# Patient Record
Sex: Female | Born: 1950 | Race: White | Hispanic: No | Marital: Married | Smoking: Never smoker
Health system: Southern US, Community
[De-identification: ages and names within clinical notes are randomized; demographics above are authoritative.]

## PROBLEM LIST (undated history)

## (undated) DIAGNOSIS — R413 Other amnesia: Secondary | ICD-10-CM

## (undated) DIAGNOSIS — D279 Benign neoplasm of unspecified ovary: Secondary | ICD-10-CM

## (undated) DIAGNOSIS — I10 Essential (primary) hypertension: Secondary | ICD-10-CM

## (undated) DIAGNOSIS — E785 Hyperlipidemia, unspecified: Secondary | ICD-10-CM

## (undated) DIAGNOSIS — E119 Type 2 diabetes mellitus without complications: Secondary | ICD-10-CM

## (undated) DIAGNOSIS — I639 Cerebral infarction, unspecified: Secondary | ICD-10-CM

## (undated) DIAGNOSIS — E039 Hypothyroidism, unspecified: Secondary | ICD-10-CM

## (undated) HISTORY — DX: Other amnesia: R41.3

## (undated) HISTORY — DX: Cerebral infarction, unspecified: I63.9

## (undated) HISTORY — PX: OTHER SURGICAL HISTORY: SHX169

## (undated) HISTORY — DX: Type 2 diabetes mellitus without complications: E11.9

## (undated) HISTORY — DX: Essential (primary) hypertension: I10

## (undated) HISTORY — DX: Benign neoplasm of unspecified ovary: D27.9

## (undated) HISTORY — DX: Hypothyroidism, unspecified: E03.9

## (undated) HISTORY — DX: Hyperlipidemia, unspecified: E78.5

---

## 1998-05-15 ENCOUNTER — Observation Stay (HOSPITAL_COMMUNITY): Admission: AD | Admit: 1998-05-15 | Discharge: 1998-05-16 | Payer: Self-pay | Admitting: Cardiology

## 1998-05-15 ENCOUNTER — Encounter: Payer: Self-pay | Admitting: Cardiology

## 2003-02-15 DIAGNOSIS — D279 Benign neoplasm of unspecified ovary: Secondary | ICD-10-CM

## 2003-02-15 HISTORY — PX: TOTAL ABDOMINAL HYSTERECTOMY W/ BILATERAL SALPINGOOPHORECTOMY: SHX83

## 2003-02-15 HISTORY — DX: Benign neoplasm of unspecified ovary: D27.9

## 2003-12-09 ENCOUNTER — Ambulatory Visit: Admission: RE | Admit: 2003-12-09 | Discharge: 2003-12-09 | Payer: Self-pay | Admitting: Gynecology

## 2003-12-16 ENCOUNTER — Inpatient Hospital Stay (HOSPITAL_COMMUNITY): Admission: RE | Admit: 2003-12-16 | Discharge: 2003-12-20 | Payer: Self-pay | Admitting: Gynecologic Oncology

## 2003-12-16 ENCOUNTER — Ambulatory Visit: Payer: Self-pay | Admitting: Internal Medicine

## 2004-01-20 ENCOUNTER — Ambulatory Visit: Admission: RE | Admit: 2004-01-20 | Discharge: 2004-01-20 | Payer: Self-pay | Admitting: Gynecologic Oncology

## 2005-02-07 IMAGING — CR DG CHEST 1V PORT
1 series · 1 of 1 positions shown · non-contrast
Comparison: none

CLINICAL DATA: Pelvic mass and progressive air space disease.
 PORTABLE CHEST:
 Comparing 12/18/03.  
 Borderline cardiomegaly is noted.  Low lung volumes are present.  There are patchy bilateral air space opacities which could be a manifestation of atypical pneumonia or edema.  However these appear mildly improved compared to prior exam.

[view not recorded]
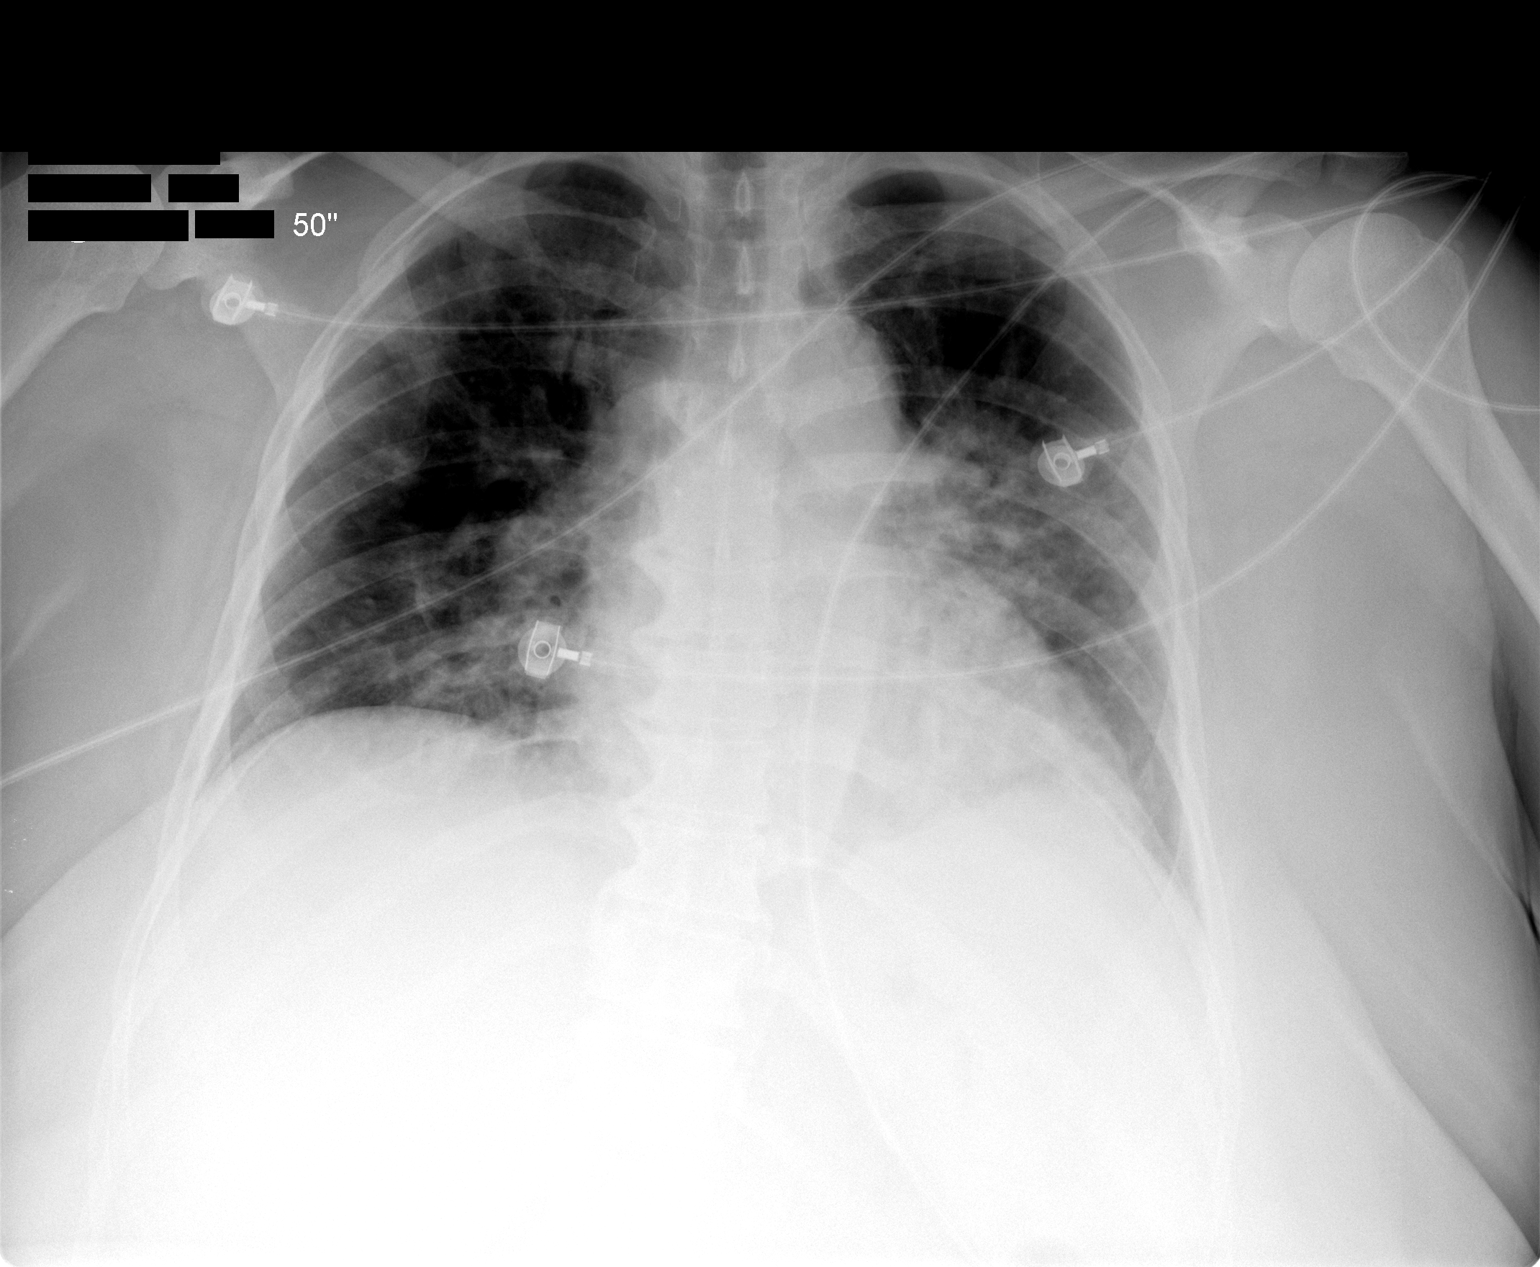

[1 of 1 positions shown; findings below may reference images not displayed]

IMPRESSION: Mildly improved air space opacities compared to the prior exam.

## 2007-03-18 DIAGNOSIS — I639 Cerebral infarction, unspecified: Secondary | ICD-10-CM

## 2007-03-18 HISTORY — DX: Cerebral infarction, unspecified: I63.9

## 2016-05-17 DIAGNOSIS — Z1389 Encounter for screening for other disorder: Secondary | ICD-10-CM | POA: Diagnosis not present

## 2016-05-17 DIAGNOSIS — E119 Type 2 diabetes mellitus without complications: Secondary | ICD-10-CM | POA: Diagnosis not present

## 2016-05-17 DIAGNOSIS — N3281 Overactive bladder: Secondary | ICD-10-CM | POA: Diagnosis not present

## 2016-05-17 DIAGNOSIS — I10 Essential (primary) hypertension: Secondary | ICD-10-CM | POA: Diagnosis not present

## 2016-05-17 DIAGNOSIS — F3289 Other specified depressive episodes: Secondary | ICD-10-CM | POA: Diagnosis not present

## 2016-05-17 DIAGNOSIS — E784 Other hyperlipidemia: Secondary | ICD-10-CM | POA: Diagnosis not present

## 2016-05-17 DIAGNOSIS — E1140 Type 2 diabetes mellitus with diabetic neuropathy, unspecified: Secondary | ICD-10-CM | POA: Diagnosis not present

## 2016-05-17 DIAGNOSIS — E1142 Type 2 diabetes mellitus with diabetic polyneuropathy: Secondary | ICD-10-CM | POA: Diagnosis not present

## 2016-05-17 DIAGNOSIS — Z6841 Body Mass Index (BMI) 40.0 and over, adult: Secondary | ICD-10-CM | POA: Diagnosis not present

## 2016-05-17 DIAGNOSIS — E038 Other specified hypothyroidism: Secondary | ICD-10-CM | POA: Diagnosis not present

## 2016-05-17 DIAGNOSIS — I638 Other cerebral infarction: Secondary | ICD-10-CM | POA: Diagnosis not present

## 2016-07-26 DIAGNOSIS — H524 Presbyopia: Secondary | ICD-10-CM | POA: Diagnosis not present

## 2016-07-26 DIAGNOSIS — H2513 Age-related nuclear cataract, bilateral: Secondary | ICD-10-CM | POA: Diagnosis not present

## 2016-07-26 DIAGNOSIS — H25013 Cortical age-related cataract, bilateral: Secondary | ICD-10-CM | POA: Diagnosis not present

## 2016-07-26 DIAGNOSIS — E119 Type 2 diabetes mellitus without complications: Secondary | ICD-10-CM | POA: Diagnosis not present

## 2016-09-02 DIAGNOSIS — G3184 Mild cognitive impairment, so stated: Secondary | ICD-10-CM | POA: Diagnosis not present

## 2016-09-02 DIAGNOSIS — E784 Other hyperlipidemia: Secondary | ICD-10-CM | POA: Diagnosis not present

## 2016-09-02 DIAGNOSIS — I10 Essential (primary) hypertension: Secondary | ICD-10-CM | POA: Diagnosis not present

## 2016-09-02 DIAGNOSIS — F329 Major depressive disorder, single episode, unspecified: Secondary | ICD-10-CM | POA: Diagnosis not present

## 2016-09-02 DIAGNOSIS — I639 Cerebral infarction, unspecified: Secondary | ICD-10-CM | POA: Diagnosis not present

## 2016-09-02 DIAGNOSIS — Z1389 Encounter for screening for other disorder: Secondary | ICD-10-CM | POA: Diagnosis not present

## 2016-09-02 DIAGNOSIS — E1142 Type 2 diabetes mellitus with diabetic polyneuropathy: Secondary | ICD-10-CM | POA: Diagnosis not present

## 2016-09-02 DIAGNOSIS — Z6841 Body Mass Index (BMI) 40.0 and over, adult: Secondary | ICD-10-CM | POA: Diagnosis not present

## 2016-09-02 DIAGNOSIS — E1140 Type 2 diabetes mellitus with diabetic neuropathy, unspecified: Secondary | ICD-10-CM | POA: Diagnosis not present

## 2016-09-02 DIAGNOSIS — E038 Other specified hypothyroidism: Secondary | ICD-10-CM | POA: Diagnosis not present

## 2016-10-05 DIAGNOSIS — R9089 Other abnormal findings on diagnostic imaging of central nervous system: Secondary | ICD-10-CM | POA: Diagnosis not present

## 2016-10-05 DIAGNOSIS — R4182 Altered mental status, unspecified: Secondary | ICD-10-CM | POA: Diagnosis not present

## 2016-10-05 DIAGNOSIS — R350 Frequency of micturition: Secondary | ICD-10-CM | POA: Diagnosis not present

## 2016-10-05 DIAGNOSIS — E119 Type 2 diabetes mellitus without complications: Secondary | ICD-10-CM | POA: Diagnosis not present

## 2016-10-05 DIAGNOSIS — R9082 White matter disease, unspecified: Secondary | ICD-10-CM | POA: Diagnosis not present

## 2016-10-05 DIAGNOSIS — R413 Other amnesia: Secondary | ICD-10-CM | POA: Diagnosis not present

## 2016-10-05 DIAGNOSIS — I10 Essential (primary) hypertension: Secondary | ICD-10-CM | POA: Diagnosis not present

## 2016-10-05 DIAGNOSIS — I672 Cerebral atherosclerosis: Secondary | ICD-10-CM | POA: Diagnosis not present

## 2016-10-06 DIAGNOSIS — R4182 Altered mental status, unspecified: Secondary | ICD-10-CM | POA: Diagnosis not present

## 2016-10-06 DIAGNOSIS — I672 Cerebral atherosclerosis: Secondary | ICD-10-CM | POA: Diagnosis not present

## 2016-10-06 DIAGNOSIS — R9089 Other abnormal findings on diagnostic imaging of central nervous system: Secondary | ICD-10-CM | POA: Diagnosis not present

## 2016-10-06 DIAGNOSIS — R9082 White matter disease, unspecified: Secondary | ICD-10-CM | POA: Diagnosis not present

## 2016-10-12 ENCOUNTER — Ambulatory Visit (INDEPENDENT_AMBULATORY_CARE_PROVIDER_SITE_OTHER): Payer: Medicare Other | Admitting: Diagnostic Neuroimaging

## 2016-10-12 ENCOUNTER — Encounter: Payer: Self-pay | Admitting: *Deleted

## 2016-10-12 DIAGNOSIS — R413 Other amnesia: Secondary | ICD-10-CM

## 2016-10-12 NOTE — Patient Instructions (Signed)
Thank you for coming to see [place] at [facility]. I hope we have been able to provide you high quality care today.  You may receive a patient satisfaction survey over the next few weeks. We would appreciate your feedback and comments so that we may continue to improve ourselves and the health of our patients.  - check MRI brain   - check B12   - visit alz.org  - consider MIND diet   ~~~~~~~~~~~~~~~~~~~~~~~~~~~~~~~~~~~~~~~~~~~~~~~~~~~~~~~~~~~~~~~~~  DR. Willoughby Doell'S GUIDE TO HAPPY AND HEALTHY LIVING These are some of my general health and wellness recommendations. Some of them may apply to you better than others. Please use common sense as you try these suggestions and feel free to ask me any questions.   ACTIVITY/FITNESS Mental, social, emotional and physical stimulation are very important for brain and body health. Try learning a new activity (arts, music, language, sports, games).  Keep moving your body to the best of your abilities. You can do this at home, inside or outside, the park, community center, gym or anywhere you like. Consider a physical therapist or personal trainer to get started. Consider the app Sworkit. Fitness trackers such as smart-watches, smart-phones or Fitbits can help as well.   NUTRITION Eat more plants: colorful vegetables, nuts, seeds and berries.  Eat less sugar, salt, preservatives and processed foods.  Avoid toxins such as cigarettes and alcohol.  Drink water when you are thirsty. Warm water with a slice of lemon is an excellent morning drink to start the day.  Consider these websites for more information The Nutrition Source (https://www.henry-hernandez.biz/) Precision Nutrition (WindowBlog.ch)   RELAXATION Consider practicing mindfulness meditation or other relaxation techniques such as deep breathing, prayer, yoga, tai chi, massage. See website mindful.org or the apps Headspace or Calm  to help get started.   SLEEP Try to get at least 7-8+ hours sleep per day. Regular exercise and reduced caffeine will help you sleep better. Practice good sleep hygeine techniques. See website sleep.org for more information.   PLANNING Prepare estate planning, living will, healthcare POA documents. Sometimes this is best planned with the help of an attorney. Theconversationproject.org and agingwithdignity.org are excellent resources.

## 2016-10-12 NOTE — Progress Notes (Signed)
[facility]  PATIENT: Deanna Fisher DOB: [birth year]  REFERRING CLINICIAN: S South HISTORY FROM: patient  REASON FOR VISIT: new consult    HISTORICAL  CHIEF COMPLAINT:  Chief Complaint  Patient presents with  . Gait Problem    rm 7, New Pt, Husband- Bucky, dagtrs- Trellis Moment  . Mild cognitive impairment    MMSE 17    HISTORY OF PRESENT ILLNESS:   66 year old female here for evaluation of memory problems.  For past 6-12 months patient has had onset of short-term memory problems, confusion, getting lost driving, increasing depression. She is more emotional. Patient staying home more. She's not taking her medications properly. Patient forgetting status of her parents (who are deceased) but asking her own family if they're still alive.  10/05/16 patient had a stressful day taking care of some grandchildren, and when her husband came home she was significantly withdrawn, verbally unresponsive, holding her hands and shaking. She was taken to the hospital for evaluation ([place], [place]). By the time patient came to the hospital she was returning back to baseline. CT scan and basic lab testing were unremarkable. Patient was discharged and recommended to follow-up in outpatient neurology clinic.   REVIEW OF SYSTEMS: Full 14 system review of systems performed and negative with exception of: Fatigue chest pain blurred vision eye pain snoring easy bruising feeling hot increased thirst depression decreased energy.  ALLERGIES: No Known Allergies  HOME MEDICATIONS: No outpatient prescriptions prior to visit.   No facility-administered medications prior to visit.     PAST MEDICAL HISTORY: Past Medical History:  Diagnosis Date  . CVA (cerebral vascular accident) ([place]) 03/2007  . Diabetes ([place])    type 2  . HTN (hypertension)   . Hyperlipidemia   . Hypothyroid   . Memory loss   . Struma ovarii 2005    PAST SURGICAL HISTORY: Past Surgical History:    Procedure Laterality Date  . CESAREAN SECTION    . peritoneal tumor resection    . TOTAL ABDOMINAL HYSTERECTOMY W/ BILATERAL SALPINGOOPHORECTOMY  2005    FAMILY HISTORY: Family History  Problem Relation Age of Onset  . Cancer Mother        thyroid  . Dementia Mother   . Cancer Father        leukemia  . Other Sister        wagoner's disease  . Cystic fibrosis Sister     SOCIAL HISTORY:  Social History   Social History  . Marital status: Married    Spouse name: N/A  . Number of children: 4  . Years of education: 8   Occupational History  .      NA   Social History Main Topics  . Smoking status: Never Smoker  . Smokeless tobacco: Never Used  . Alcohol use No  . Drug use: No  . Sexual activity: Not on file   Other Topics Concern  . Not on file   Social History Narrative   Lives with husband   Caffeine- 1-2 tea      PHYSICAL EXAM  GENERAL EXAM/CONSTITUTIONAL: Vitals:  Vitals:   10/12/16 0853  BP: 102/61  Pulse: 75  Weight: 227 lb 12.8 oz (103.3 kg)  Height: 5\' 2"  (1.575 m)     Body mass index is 41.67 kg/m.  Visual Acuity Screening   Right eye Left eye Both eyes  Without correction:     With correction: 20/30 20/30      Patient is in no distress;  well developed, nourished and groomed; neck is supple  CARDIOVASCULAR:  Examination of carotid arteries is normal; no carotid bruits  Regular rate and rhythm, no murmurs  Examination of peripheral vascular system by observation and palpation is normal  EYES:  Ophthalmoscopic exam of optic discs and posterior segments is normal; no papilledema or hemorrhages  MUSCULOSKELETAL:  Gait, strength, tone, movements noted in Neurologic exam below  NEUROLOGIC: MENTAL STATUS:  MMSE - Mini Mental State Exam 10/12/2016  Orientation to time 1  Orientation to Place 5  Registration 2  Attention/ Calculation 1  Recall 0  Language- name 2 objects 2  Language- repeat 0  Language- follow 3 step  command 3  Language- read & follow direction 1  Write a sentence 1  Copy design 1  Total score 17    awake, alert, oriented to person, place and time  DECR MEMORY; remote memory intact  normal attention and concentration  language fluent, comprehension intact, naming intact,   fund of knowledge appropriate  INT TEARFUL  DECR INSIGHT  CRANIAL NERVE:   2nd - no papilledema on fundoscopic exam  2nd, 3rd, 4th, 6th - pupils equal and reactive to light, visual fields full to confrontation, extraocular muscles intact, no nystagmus  5th - facial sensation symmetric  7th - facial strength symmetric  8th - hearing intact  9th - palate elevates symmetrically, uvula midline  11th - shoulder shrug symmetric  12th - tongue protrusion midline  MOTOR:   normal bulk and tone, full strength in the BUE, BLE  SENSORY:   normal and symmetric to light touch, temperature, vibration  COORDINATION:   finger-nose-finger, fine finger movements normal  REFLEXES:   deep tendon reflexes present and symmetric  GAIT/STATION:   narrow based gait    DIAGNOSTIC DATA (LABS, IMAGING, TESTING) - I reviewed patient records, labs, notes, testing and imaging myself where available.  No results found for: WBC, HGB, HCT, MCV, PLT No results found for: NA, K, CL, CO2, GLUCOSE, BUN, CREATININE, CALCIUM, PROT, ALBUMIN, AST, ALT, ALKPHOS, BILITOT, GFRNONAA, GFRAA No results found for: CHOL, HDL, LDLCALC, LDLDIRECT, TRIG, CHOLHDL No results found for: HGBA1C No results found for: VITAMINB12 No results found for: TSH      ASSESSMENT AND PLAN  66 y.o. year old female here with 6-12 months of gradual onset progressive short-term memory loss, confusion, difficulty with driving dresses, with sudden confusion and withdrawal on 10/05/16 in the setting of increased stress. MMSE 17 out of 30. 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 Episode on 10/05/16 may have been a  stress reaction/depression event.    Dx: moderate dementia + depression  1. Memory loss      PLAN:  I spent 60 minutes of face to face time with patient. Greater than 50% of time was spent in counseling and coordination of care with patient. In summary we discussed:   - check MRI brain (rule out other causes of memory loss; vascular, inflamm, infx) - check B12 - encouraged brain healthy activities - dementia resources reviewed and provided to patient and family - may consider donepezil and memantine at next visit - continue citalopram for depression  Orders Placed This Encounter  Procedures  . MR BRAIN WO CONTRAST  . Vitamin B12   Return in about 3 months (around 01/12/2017).    Penni Bombard, MD 1/66/0630, 1:60 AM Certified in Neurology, Neurophysiology and Neuroimaging  [facility] [address], [place], [place] [address] [phone]

## 2016-10-13 LAB — VITAMIN B12: Vitamin B-12: 284 pg/mL (ref 232–1245)

## 2016-10-18 ENCOUNTER — Telehealth: Payer: Self-pay | Admitting: *Deleted

## 2016-10-18 NOTE — Telephone Encounter (Signed)
Spoke with daughter, Janett Billow on [place] and informed her the patient's lab, Vit B12 level is normal. She stated she would let patient know, verbalized appreciation.

## 2016-10-22 ENCOUNTER — Ambulatory Visit
Admission: RE | Admit: 2016-10-22 | Discharge: 2016-10-22 | Disposition: A | Discharge status: Home / Self Care | Payer: Medicare Other | Source: Ambulatory Visit | Attending: Diagnostic Neuroimaging | Admitting: Diagnostic Neuroimaging

## 2016-10-22 DIAGNOSIS — R413 Other amnesia: Secondary | ICD-10-CM | POA: Diagnosis not present

## 2016-10-23 ENCOUNTER — Other Ambulatory Visit: Payer: Self-pay

## 2016-10-25 ENCOUNTER — Ambulatory Visit: Payer: Medicare Other | Admitting: Diagnostic Neuroimaging

## 2016-11-02 ENCOUNTER — Telehealth: Payer: Self-pay | Admitting: Diagnostic Neuroimaging

## 2016-11-02 NOTE — Telephone Encounter (Signed)
PTs daughter already call about MRI  by Bedelia Person. See note from 11/02/2016.

## 2016-11-02 NOTE — Telephone Encounter (Signed)
Patient's daughterr calling to get MRI results.

## 2016-11-02 NOTE — Telephone Encounter (Signed)
Spoke to Damar, daughter of pt.  Relayed that MRI results as per Dr. Gladstone Lighter result note.  She verbalized understanding.

## 2016-11-02 NOTE — Telephone Encounter (Signed)
Left vm for patient to call back about MRI results.

## 2017-01-04 DIAGNOSIS — I10 Essential (primary) hypertension: Secondary | ICD-10-CM | POA: Diagnosis not present

## 2017-01-04 DIAGNOSIS — G3184 Mild cognitive impairment, so stated: Secondary | ICD-10-CM | POA: Diagnosis not present

## 2017-01-04 DIAGNOSIS — Z6841 Body Mass Index (BMI) 40.0 and over, adult: Secondary | ICD-10-CM | POA: Diagnosis not present

## 2017-01-04 DIAGNOSIS — F3289 Other specified depressive episodes: Secondary | ICD-10-CM | POA: Diagnosis not present

## 2017-01-04 DIAGNOSIS — E7849 Other hyperlipidemia: Secondary | ICD-10-CM | POA: Diagnosis not present

## 2017-01-04 DIAGNOSIS — Z23 Encounter for immunization: Secondary | ICD-10-CM | POA: Diagnosis not present

## 2017-01-04 DIAGNOSIS — E1142 Type 2 diabetes mellitus with diabetic polyneuropathy: Secondary | ICD-10-CM | POA: Diagnosis not present

## 2017-01-04 DIAGNOSIS — I6389 Other cerebral infarction: Secondary | ICD-10-CM | POA: Diagnosis not present

## 2017-01-04 DIAGNOSIS — N3281 Overactive bladder: Secondary | ICD-10-CM | POA: Diagnosis not present

## 2017-01-04 DIAGNOSIS — E1140 Type 2 diabetes mellitus with diabetic neuropathy, unspecified: Secondary | ICD-10-CM | POA: Diagnosis not present

## 2017-01-04 DIAGNOSIS — R2689 Other abnormalities of gait and mobility: Secondary | ICD-10-CM | POA: Diagnosis not present

## 2017-01-04 DIAGNOSIS — E538 Deficiency of other specified B group vitamins: Secondary | ICD-10-CM | POA: Diagnosis not present

## 2017-01-13 ENCOUNTER — Ambulatory Visit (INDEPENDENT_AMBULATORY_CARE_PROVIDER_SITE_OTHER): Payer: Medicare Other | Admitting: Diagnostic Neuroimaging

## 2017-01-13 ENCOUNTER — Encounter: Payer: Self-pay | Admitting: Diagnostic Neuroimaging

## 2017-01-13 VITALS — BP 123/75 | HR 74 | Ht 62.0 in | Wt 221.6 lb

## 2017-01-13 DIAGNOSIS — F0390 Unspecified dementia without behavioral disturbance: Secondary | ICD-10-CM | POA: Diagnosis not present

## 2017-01-13 DIAGNOSIS — R413 Other amnesia: Secondary | ICD-10-CM | POA: Diagnosis not present

## 2017-01-13 DIAGNOSIS — F03A0 Unspecified dementia, mild, without behavioral disturbance, psychotic disturbance, mood disturbance, and anxiety: Secondary | ICD-10-CM

## 2017-01-13 MED ORDER — MEMANTINE HCL 10 MG PO TABS: 10.0000 mg | ORAL_TABLET | Freq: Two times a day (BID) | ORAL | 12 refills | Status: DC

## 2017-01-13 NOTE — Patient Instructions (Signed)
-   start memantine 10mg  at bedtime x 1 week; then increase to twice a day

## 2017-01-13 NOTE — Progress Notes (Signed)
[facility]  PATIENT: Deanna Fisher DOB: 08-23-1950  REFERRING CLINICIAN: S South HISTORY FROM: patient and daughters [name], [place]) REASON FOR VISIT: follow up    HISTORICAL  CHIEF COMPLAINT:  Chief Complaint  Patient presents with  . Follow-up  . Memory Loss    doing about same.     HISTORY OF PRESENT ILLNESS:   UPDATE (01/13/17, VRP): Since last visit, doing well. Tolerating meds. No alleviating or aggravating factors. Overall stable. Slightly better with mood and alertness and depression.  PRIOR HPI (11/02/16, VRP): 66 year old female here for evaluation of memory problems. For past 6-12 months patient has had onset of short-term memory problems, confusion, getting lost driving, increasing depression. She is more emotional. Patient staying home more. She's not taking her medications properly. Patient forgetting status of her parents (who are deceased) but asking her own family if they're still alive. 10/05/16 patient had a stressful day taking care of some grandchildren, and when her husband came home she was significantly withdrawn, verbally unresponsive, holding her hands and shaking. She was taken to the hospital for evaluation ([facility], [place]). By the time patient came to the hospital she was returning back to baseline. CT scan and basic lab testing were unremarkable. Patient was discharged and recommended to follow-up in outpatient neurology clinic.   REVIEW OF SYSTEMS: Full 14 system review of systems performed and negative with exception of: confusion anxiety depression.  ALLERGIES: No Known Allergies  HOME MEDICATIONS: Outpatient Medications Prior to Visit  Medication Sig Dispense Refill  . amlodipine-atorvastatin (CADUET) 10-10 MG tablet Take 1 tablet by mouth daily.    Marland Kitchen aspirin EC 81 MG tablet Take 81 mg by mouth daily.    . citalopram (CELEXA) 20 MG tablet Take 20 mg by mouth daily.    Marland Kitchen gabapentin (NEURONTIN) 100 MG capsule Take 100 mg by  mouth daily.     . metoprolol tartrate (LOPRESSOR) 25 MG tablet Take 25 mg by mouth daily.     Marland Kitchen SIMVASTATIN PO Take by mouth.    . sitaGLIPtin-metformin (JANUMET) 50-1000 MG tablet Take 1 tablet by mouth daily.     Marland Kitchen UNABLE TO FIND daily. Med Name: glimepiride      No facility-administered medications prior to visit.     PAST MEDICAL HISTORY: Past Medical History:  Diagnosis Date  . CVA (cerebral vascular accident) ([place]) 03/2007  . Diabetes ([place])    type 2  . HTN (hypertension)   . Hyperlipidemia   . Hypothyroid   . Memory loss   . Struma ovarii 2005    PAST SURGICAL HISTORY: Past Surgical History:  Procedure Laterality Date  . CESAREAN SECTION    . peritoneal tumor resection    . TOTAL ABDOMINAL HYSTERECTOMY W/ BILATERAL SALPINGOOPHORECTOMY  2005    FAMILY HISTORY: Family History  Problem Relation Age of Onset  . Cancer Mother        thyroid  . Dementia Mother   . Cancer Father        leukemia  . Other Sister        wagoner's disease  . Cystic fibrosis Sister     SOCIAL HISTORY:  Social History   Socioeconomic History  . Marital status: Married    Spouse name: Not on file  . Number of children: 4  . Years of education: 8  . Highest education level: Not on file  Social Needs  . Financial resource strain: Not on file  . Food insecurity - worry: Not on  file  . Food insecurity - inability: Not on file  . Transportation needs - medical: Not on file  . Transportation needs - non-medical: Not on file  Occupational History    Comment: NA  Tobacco Use  . Smoking status: Never Smoker  . Smokeless tobacco: Never Used  Substance and Sexual Activity  . Alcohol use: No  . Drug use: No  . Sexual activity: Not on file  Other Topics Concern  . Not on file  Social History Narrative   Lives with husband   Caffeine- 1-2 tea      PHYSICAL EXAM  GENERAL EXAM/CONSTITUTIONAL: Vitals:  Vitals:   01/13/17 0832  BP: 123/75  Pulse: 74  Weight: 221 lb 9.6 oz  (100.5 kg)  Height: 5\' 2"  (1.575 m)   Body mass index is 40.53 kg/m. No exam data present  Patient is in no distress; well developed, nourished and groomed; neck is supple  CARDIOVASCULAR:  Examination of carotid arteries is normal; no carotid bruits  Regular rate and rhythm, no murmurs  Examination of peripheral vascular system by observation and palpation is normal  EYES:  Ophthalmoscopic exam of optic discs and posterior segments is normal; no papilledema or hemorrhages  MUSCULOSKELETAL:  Gait, strength, tone, movements noted in Neurologic exam below  NEUROLOGIC: MENTAL STATUS:  MMSE - [place] Exam 01/13/2017 10/12/2016  Orientation to time 4 1  Orientation to Place 2 5  Registration 3 2  Attention/ Calculation 1 1  Recall 0 0  Language- name 2 objects 2 2  Language- repeat 1 0  Language- follow 3 step command 3 3  Language- read & follow direction 1 1  Write a sentence 1 1  Copy design 1 1  Total score 19 17    awake, alert, oriented to person, place and time  DECR MEMORY; remote memory intact  normal attention and concentration  language fluent, comprehension intact, naming intact,   fund of knowledge appropriate  CRANIAL NERVE:   2nd - no papilledema on fundoscopic exam  2nd, 3rd, 4th, 6th - pupils equal and reactive to light, visual fields full to confrontation, extraocular muscles intact, no nystagmus  5th - facial sensation symmetric  7th - facial strength symmetric  8th - hearing intact  9th - palate elevates symmetrically, uvula midline  11th - shoulder shrug symmetric  12th - tongue protrusion midline  MOTOR:   normal bulk and tone, full strength in the BUE, BLE  SENSORY:   normal and symmetric to light touch, temperature, vibration  COORDINATION:   finger-nose-finger, fine finger movements normal  REFLEXES:   deep tendon reflexes present and symmetric  GAIT/STATION:   narrow based gait    DIAGNOSTIC DATA  (LABS, IMAGING, TESTING) - I reviewed patient records, labs, notes, testing and imaging myself where available.  No results found for: WBC, HGB, HCT, MCV, PLT No results found for: NA, K, CL, CO2, GLUCOSE, BUN, CREATININE, CALCIUM, PROT, ALBUMIN, AST, ALT, ALKPHOS, BILITOT, GFRNONAA, GFRAA No results found for: CHOL, HDL, LDLCALC, LDLDIRECT, TRIG, CHOLHDL No results found for: HGBA1C Lab Results  Component Value Date   VITAMINB12 284 10/12/2016   No results found for: TSH  10/22/16 MRI brain 1.    Mild generalized cortical atrophy, progressed when compared to the 2009 MRI. The atrophy is most pronounced in the anterior temporal lobes. 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 2.    Moderate extent of T2/FLAIR foci in the pons and hemispheres consistent with artery to chronic medical vascular ischemic change,  also progress we'll compared to the 2009 MRI. There is a small stable lacunar infarction in the left thalamus. 3.    There are no acute findings.    ASSESSMENT AND PLAN  66 y.o. year old female here with 6-12 months of gradual onset progressive short-term memory loss, confusion, difficulty with driving directions, with sudden confusion and withdrawal on 10/05/16 in the setting of increased stress. MMSE 17/30 --19/30. 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 Episode on 10/05/16 may have been a stress reaction/depression event.    Dx: mild-moderate dementia + depression  1. Memory loss   2. Mild dementia      PLAN:  I spent 15 minutes of face to face time with patient. Greater than 50% of time was spent in counseling and coordination of care with patient. In summary we discussed:   MEMORY LOSS / DEMENTIA (mild-moderate) - encouraged brain healthy activities - dementia resources reviewed and provided to patient and family - start memantine 10mg  at bedtime x 1 week; then increase to twice a day   DEPRESSION - continue citalopram for depression  Meds  ordered this encounter  Medications  . memantine (NAMENDA) 10 MG tablet    Sig: Take 1 tablet (10 mg total) by mouth 2 (two) times daily.    Dispense:  60 tablet    Refill:  12   Return in about 1 year (around 01/13/2018).    Penni Bombard, MD 77/41/4239, 5:32 AM Certified in Neurology, Neurophysiology and Neuroimaging  [facility] [address] [place], [place] [address] [phone]

## 2017-05-10 DIAGNOSIS — E1140 Type 2 diabetes mellitus with diabetic neuropathy, unspecified: Secondary | ICD-10-CM | POA: Diagnosis not present

## 2017-05-10 DIAGNOSIS — E538 Deficiency of other specified B group vitamins: Secondary | ICD-10-CM | POA: Diagnosis not present

## 2017-05-10 DIAGNOSIS — E1142 Type 2 diabetes mellitus with diabetic polyneuropathy: Secondary | ICD-10-CM | POA: Diagnosis not present

## 2017-05-10 DIAGNOSIS — I639 Cerebral infarction, unspecified: Secondary | ICD-10-CM | POA: Diagnosis not present

## 2017-05-10 DIAGNOSIS — I10 Essential (primary) hypertension: Secondary | ICD-10-CM | POA: Diagnosis not present

## 2017-05-10 DIAGNOSIS — E7849 Other hyperlipidemia: Secondary | ICD-10-CM | POA: Diagnosis not present

## 2017-05-10 DIAGNOSIS — E038 Other specified hypothyroidism: Secondary | ICD-10-CM | POA: Diagnosis not present

## 2017-05-10 DIAGNOSIS — R269 Unspecified abnormalities of gait and mobility: Secondary | ICD-10-CM | POA: Diagnosis not present

## 2017-05-10 DIAGNOSIS — F329 Major depressive disorder, single episode, unspecified: Secondary | ICD-10-CM | POA: Diagnosis not present

## 2017-05-10 DIAGNOSIS — G3184 Mild cognitive impairment, so stated: Secondary | ICD-10-CM | POA: Diagnosis not present

## 2017-07-28 DIAGNOSIS — H25013 Cortical age-related cataract, bilateral: Secondary | ICD-10-CM | POA: Diagnosis not present

## 2017-07-28 DIAGNOSIS — H2513 Age-related nuclear cataract, bilateral: Secondary | ICD-10-CM | POA: Diagnosis not present

## 2017-07-28 DIAGNOSIS — H5203 Hypermetropia, bilateral: Secondary | ICD-10-CM | POA: Diagnosis not present

## 2017-07-28 DIAGNOSIS — E119 Type 2 diabetes mellitus without complications: Secondary | ICD-10-CM | POA: Diagnosis not present

## 2017-08-10 DIAGNOSIS — E1142 Type 2 diabetes mellitus with diabetic polyneuropathy: Secondary | ICD-10-CM | POA: Diagnosis not present

## 2017-08-10 DIAGNOSIS — F3289 Other specified depressive episodes: Secondary | ICD-10-CM | POA: Diagnosis not present

## 2017-08-10 DIAGNOSIS — Z1389 Encounter for screening for other disorder: Secondary | ICD-10-CM | POA: Diagnosis not present

## 2017-08-10 DIAGNOSIS — E038 Other specified hypothyroidism: Secondary | ICD-10-CM | POA: Diagnosis not present

## 2017-08-10 DIAGNOSIS — I10 Essential (primary) hypertension: Secondary | ICD-10-CM | POA: Diagnosis not present

## 2017-08-10 DIAGNOSIS — F0390 Unspecified dementia without behavioral disturbance: Secondary | ICD-10-CM | POA: Diagnosis not present

## 2017-08-10 DIAGNOSIS — E7849 Other hyperlipidemia: Secondary | ICD-10-CM | POA: Diagnosis not present

## 2017-08-10 DIAGNOSIS — E538 Deficiency of other specified B group vitamins: Secondary | ICD-10-CM | POA: Diagnosis not present

## 2017-08-10 DIAGNOSIS — E1140 Type 2 diabetes mellitus with diabetic neuropathy, unspecified: Secondary | ICD-10-CM | POA: Diagnosis not present

## 2017-08-10 DIAGNOSIS — I6389 Other cerebral infarction: Secondary | ICD-10-CM | POA: Diagnosis not present

## 2017-08-10 DIAGNOSIS — Z6841 Body Mass Index (BMI) 40.0 and over, adult: Secondary | ICD-10-CM | POA: Diagnosis not present

## 2017-09-26 ENCOUNTER — Telehealth: Payer: Self-pay | Admitting: Diagnostic Neuroimaging

## 2017-09-26 DIAGNOSIS — R21 Rash and other nonspecific skin eruption: Secondary | ICD-10-CM | POA: Diagnosis not present

## 2017-09-26 DIAGNOSIS — T50995A Adverse effect of other drugs, medicaments and biological substances, initial encounter: Secondary | ICD-10-CM | POA: Diagnosis not present

## 2017-09-26 NOTE — Telephone Encounter (Signed)
Pt's daughter, [name] not on DPR said PCP started her on respiradol for anxiety about 1-1/2 month ago. Pt did ok then it was increased again about 2 weeks ago and did ok. About 1 ago week the patient began hallucinating, not engaging in conversation. She has spoken with PCP office and was advised to make neurology appt. Since this daughter is not on DPR please call pt's husband Bucky/DPR at [phone] to discuss. The pt has an appt on 9/3, does the pt need to be seen sooner. This daughter brings the pt to her appt's and will be coming with her and will change the DPR.

## 2017-09-27 NOTE — Telephone Encounter (Signed)
Spoke with patient's husband, Bucky on [place]. He stated his wife's PCP advised daughter to cut Risperdal in 1/2 and give in two divided doses. He does not know strength of tablet. He stated she is still having hallucinations but has improved slightly. She is not very communicative at all.  This RN asked if he would like to see if she continues to improve and come in on 10/17/17 or reschedule. He would like her seen sooner than 10/17/17.  He can only be off work on Fridays. Scheduled her for this Fri, advised they arrive 30 minutes early, bring new insurance cards and her medication bottles. He repeated instructions correctly, verbalized understanding, appreciation.

## 2017-09-29 ENCOUNTER — Encounter: Payer: Self-pay | Admitting: Diagnostic Neuroimaging

## 2017-09-29 ENCOUNTER — Ambulatory Visit (INDEPENDENT_AMBULATORY_CARE_PROVIDER_SITE_OTHER): Payer: Medicare Other | Admitting: Diagnostic Neuroimaging

## 2017-09-29 VITALS — BP 124/68 | HR 73 | Ht 62.0 in | Wt 227.8 lb

## 2017-09-29 DIAGNOSIS — G308 Other Alzheimer's disease: Secondary | ICD-10-CM

## 2017-09-29 DIAGNOSIS — F0281 Dementia in other diseases classified elsewhere with behavioral disturbance: Secondary | ICD-10-CM

## 2017-09-29 NOTE — Progress Notes (Signed)
[facility]  PATIENT: Deanna Fisher DOB: August 18, 1950  REFERRING CLINICIAN: S [place] HISTORY FROM: patient and daughter Deanna Fisher) and husband REASON FOR VISIT: follow up    HISTORICAL  CHIEF COMPLAINT:  Chief Complaint  Patient presents with  . Memory Loss    rm 7, husband- Paramedic, dgtrRip Fisher MMSE 21  "was having hallucinations, noncommunicative; Risperdal doses spread out, much better today"  . Follow-up    HISTORY OF PRESENT ILLNESS:   UPDATE (09/29/17, VRP): Since last visit, doing poorly. Symptoms are significant, with threatening hallucinations since June 2019. Now on risperidal. Last few weeks, more sedation with increased risperidal dose. Now on twice a day dosing and feels better. No alleviating or aggravating factors. Tolerating memantine.    UPDATE (01/13/17, VRP): Since last visit, doing well. Tolerating meds. No alleviating or aggravating factors. Overall stable. Slightly better with mood and alertness and depression.  PRIOR HPI (11/02/16, VRP): 67 year old female here for evaluation of memory problems. For past 6-12 months patient has had onset of short-term memory problems, confusion, getting lost driving, increasing depression. She is more emotional. Patient staying home more. She's not taking her medications properly. Patient forgetting status of her parents (who are deceased) but asking her own family if they're still alive. 10/05/16 patient had a stressful day taking care of some grandchildren, and when her husband came home she was significantly withdrawn, verbally unresponsive, holding her hands and shaking. She was taken to the hospital for evaluation ([place], [place]). By the time patient came to the hospital she was returning back to baseline. CT scan and basic lab testing were unremarkable. Patient was discharged and recommended to follow-up in outpatient neurology clinic.   REVIEW OF SYSTEMS: Full 14 system review of systems performed and  negative with exception of: confusion anxiety depression.  ALLERGIES: No Known Allergies  HOME MEDICATIONS: Outpatient Medications Prior to Visit  Medication Sig Dispense Refill  . amlodipine-atorvastatin (CADUET) 10-10 MG tablet Take 1 tablet by mouth daily.    Marland Kitchen aspirin EC 81 MG tablet Take 81 mg by mouth daily.    . citalopram (CELEXA) 20 MG tablet Take 20 mg by mouth daily.    Marland Kitchen losartan (COZAAR) 100 MG tablet Take 100 mg by mouth daily.    . memantine (NAMENDA) 10 MG tablet Take 1 tablet (10 mg total) by mouth 2 (two) times daily. 60 tablet 12  . metoprolol tartrate (LOPRESSOR) 25 MG tablet Take 25 mg by mouth daily.     . risperiDONE (RISPERDAL) 0.25 MG tablet Take 0.25 mg by mouth at bedtime.    Marland Kitchen SIMVASTATIN PO Take by mouth.    . sitaGLIPtin-metformin (JANUMET) 50-1000 MG tablet Take 1 tablet by mouth daily.     Marland Kitchen UNABLE TO FIND daily. Med Name: glimepiride     . gabapentin (NEURONTIN) 100 MG capsule Take 100 mg by mouth daily.      No facility-administered medications prior to visit.     PAST MEDICAL HISTORY: Past Medical History:  Diagnosis Date  . CVA (cerebral vascular accident) ([place]) 03/2007  . Diabetes ([place])    type 2  . HTN (hypertension)   . Hyperlipidemia   . Hypothyroid   . Memory loss   . Struma ovarii 2005    PAST SURGICAL HISTORY: Past Surgical History:  Procedure Laterality Date  . CESAREAN SECTION    . peritoneal tumor resection    . TOTAL ABDOMINAL HYSTERECTOMY W/ BILATERAL SALPINGOOPHORECTOMY  2005    FAMILY HISTORY: Family History  Problem Relation Age of Onset  . Cancer Mother        thyroid  . Dementia Mother   . Cancer Father        leukemia  . Other Sister        wagoner's disease  . Cystic fibrosis Sister     SOCIAL HISTORY:  Social History   Socioeconomic History  . Marital status: Married    Spouse name: Not on file  . Number of children: 4  . Years of education: 8  . Highest education level: Not on file    Occupational History    Comment: NA  Social Needs  . Financial resource strain: Not on file  . Food insecurity:    Worry: Not on file    Inability: Not on file  . Transportation needs:    Medical: Not on file    Non-medical: Not on file  Tobacco Use  . Smoking status: Never Smoker  . Smokeless tobacco: Never Used  Substance and Sexual Activity  . Alcohol use: No  . Drug use: No  . Sexual activity: Not on file  Lifestyle  . Physical activity:    Days per week: Not on file    Minutes per session: Not on file  . Stress: Not on file  Relationships  . Social connections:    Talks on phone: Not on file    Gets together: Not on file    Attends religious service: Not on file    Active member of club or organization: Not on file    Attends meetings of clubs or organizations: Not on file    Relationship status: Not on file  . Intimate partner violence:    Fear of current or ex partner: Not on file    Emotionally abused: Not on file    Physically abused: Not on file    Forced sexual activity: Not on file  Other Topics Concern  . Not on file  Social History Narrative   Lives with husband   Caffeine- 1-2 tea      PHYSICAL EXAM  GENERAL EXAM/CONSTITUTIONAL: Vitals:  Vitals:   09/29/17 0924  BP: 124/68  Pulse: 73  Weight: 227 lb 12.8 oz (103.3 kg)  Height: 5\' 2"  (1.575 m)     Body mass index is 41.67 kg/m. Wt Readings from Last 3 Encounters:  09/29/17 227 lb 12.8 oz (103.3 kg)  01/13/17 221 lb 9.6 oz (100.5 kg)  10/12/16 227 lb 12.8 oz (103.3 kg)     Patient is in no distress; well developed, nourished and groomed; neck is supple  CARDIOVASCULAR:  Examination of carotid arteries is normal; no carotid bruits  Regular rate and rhythm, no murmurs  Examination of peripheral vascular system by observation and palpation is normal  EYES:  Ophthalmoscopic exam of optic discs and posterior segments is normal; no papilledema or hemorrhages No exam data  present  MUSCULOSKELETAL:  Gait, strength, tone, movements noted in Neurologic exam below  NEUROLOGIC: MENTAL STATUS:  MMSE - [place] Exam 09/29/2017 01/13/2017 10/12/2016  Orientation to time 5 4 1   Orientation to Place 4 2 5   Registration 3 3 2   Attention/ Calculation 1 1 1   Recall 0 0 0  Language- name 2 objects 2 2 2   Language- repeat 0 1 0  Language- follow 3 step command 3 3 3   Language- read & follow direction 1 1 1   Write a sentence 1 1 1   Copy design 1 1 1  Total score 21 19 17     awake, alert, oriented to person, place and time  [facility] memory   DECR attention and concentration  language fluent, comprehension intact, naming intact  fund of knowledge appropriate  CRANIAL NERVE:   2nd - no papilledema on fundoscopic exam  2nd, 3rd, 4th, 6th - pupils equal and reactive to light, visual fields full to confrontation, extraocular muscles intact, no nystagmus  5th - facial sensation symmetric  7th - facial strength symmetric  8th - hearing intact  9th - palate elevates symmetrically, uvula midline  11th - shoulder shrug symmetric  12th - tongue protrusion midline  MOTOR:   normal bulk and tone, full strength in the BUE, BLE  SENSORY:   normal and symmetric to light touch  COORDINATION:   finger-nose-finger, fine finger movements normal  REFLEXES:   deep tendon reflexes TRACE and symmetric  GAIT/STATION:   narrow based gait      DIAGNOSTIC DATA (LABS, IMAGING, TESTING) - I reviewed patient records, labs, notes, testing and imaging myself where available.  No results found for: WBC, HGB, HCT, MCV, PLT No results found for: NA, K, CL, CO2, GLUCOSE, BUN, CREATININE, CALCIUM, PROT, ALBUMIN, AST, ALT, ALKPHOS, BILITOT, GFRNONAA, GFRAA No results found for: CHOL, HDL, LDLCALC, LDLDIRECT, TRIG, CHOLHDL No results found for: HGBA1C Lab Results  Component Value Date   VITAMINB12 284 10/12/2016   No results found for: TSH  10/22/16  MRI brain 1.    Mild generalized cortical atrophy, progressed when compared to the 2009 MRI. The atrophy is most pronounced in the anterior temporal lobes. 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 2.    Moderate extent of T2/FLAIR foci in the pons and hemispheres consistent with artery to chronic medical vascular ischemic change, also progress we'll compared to the 2009 MRI. There is a small stable lacunar infarction in the left thalamus. 3.    There are no acute findings.    ASSESSMENT AND PLAN  67 y.o. year old female here with 6-12 months of gradual onset progressive short-term memory loss, confusion, difficulty with driving directions, with sudden confusion and withdrawal on 10/05/16 in the setting of increased stress. MMSE 17/30 --19/30. 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 Episode on 10/05/16 may have been a stress reaction/depression event.    Dx: mild-moderate dementia + depression  1. 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 Greater than 50% of time was spent in counseling and coordination of care with patient. This is necessary because patient's symptoms are not optimally controlled / improved. In summary we discussed: - Diagnostic results, impressions, or recommended diagnostic studies: DEMENTIA - Prognosis: fair-guarded; progression expected - Risks and benefits of management (treatment) options: risperdal risk / benefits - Patient and family education: dementia caregiver education reviewed   MEMORY LOSS / DEMENTIA (mild-moderate with behavioral disturabance) - continue memantine - continue risperidal 0.25mg  twice a day  - encouraged brain healthy activities - dementia resources reviewed and provided to patient and family  DEPRESSION - continue citalopram  Return in about 8 months (around 05/31/2018).    Penni Bombard, MD 1/61/0960, 4:54  AM Certified in Neurology, Neurophysiology and Neuroimaging  [facility] [address] [place], [place] [address] [phone]

## 2017-10-17 ENCOUNTER — Ambulatory Visit: Payer: Medicare Other | Admitting: Diagnostic Neuroimaging

## 2017-11-10 DIAGNOSIS — F0390 Unspecified dementia without behavioral disturbance: Secondary | ICD-10-CM | POA: Diagnosis not present

## 2017-11-10 DIAGNOSIS — E1140 Type 2 diabetes mellitus with diabetic neuropathy, unspecified: Secondary | ICD-10-CM | POA: Diagnosis not present

## 2017-11-10 DIAGNOSIS — E538 Deficiency of other specified B group vitamins: Secondary | ICD-10-CM | POA: Diagnosis not present

## 2017-11-10 DIAGNOSIS — G3184 Mild cognitive impairment, so stated: Secondary | ICD-10-CM | POA: Diagnosis not present

## 2017-11-10 DIAGNOSIS — N183 Chronic kidney disease, stage 3 (moderate): Secondary | ICD-10-CM | POA: Diagnosis not present

## 2017-11-10 DIAGNOSIS — E785 Hyperlipidemia, unspecified: Secondary | ICD-10-CM | POA: Diagnosis not present

## 2017-11-10 DIAGNOSIS — F3289 Other specified depressive episodes: Secondary | ICD-10-CM | POA: Diagnosis not present

## 2017-11-10 DIAGNOSIS — E038 Other specified hypothyroidism: Secondary | ICD-10-CM | POA: Diagnosis not present

## 2017-11-10 DIAGNOSIS — I10 Essential (primary) hypertension: Secondary | ICD-10-CM | POA: Diagnosis not present

## 2017-11-10 DIAGNOSIS — E1142 Type 2 diabetes mellitus with diabetic polyneuropathy: Secondary | ICD-10-CM | POA: Diagnosis not present

## 2017-11-10 DIAGNOSIS — Z6841 Body Mass Index (BMI) 40.0 and over, adult: Secondary | ICD-10-CM | POA: Diagnosis not present

## 2017-11-10 DIAGNOSIS — Z23 Encounter for immunization: Secondary | ICD-10-CM | POA: Diagnosis not present

## 2018-02-08 ENCOUNTER — Other Ambulatory Visit: Payer: Self-pay | Admitting: Diagnostic Neuroimaging

## 2018-03-21 DIAGNOSIS — E538 Deficiency of other specified B group vitamins: Secondary | ICD-10-CM | POA: Diagnosis not present

## 2018-03-21 DIAGNOSIS — E7849 Other hyperlipidemia: Secondary | ICD-10-CM | POA: Diagnosis not present

## 2018-03-21 DIAGNOSIS — F3289 Other specified depressive episodes: Secondary | ICD-10-CM | POA: Diagnosis not present

## 2018-03-21 DIAGNOSIS — E1142 Type 2 diabetes mellitus with diabetic polyneuropathy: Secondary | ICD-10-CM | POA: Diagnosis not present

## 2018-03-21 DIAGNOSIS — E119 Type 2 diabetes mellitus without complications: Secondary | ICD-10-CM | POA: Diagnosis not present

## 2018-03-21 DIAGNOSIS — E038 Other specified hypothyroidism: Secondary | ICD-10-CM | POA: Diagnosis not present

## 2018-03-21 DIAGNOSIS — F0390 Unspecified dementia without behavioral disturbance: Secondary | ICD-10-CM | POA: Diagnosis not present

## 2018-03-21 DIAGNOSIS — I10 Essential (primary) hypertension: Secondary | ICD-10-CM | POA: Diagnosis not present

## 2018-03-21 DIAGNOSIS — E1140 Type 2 diabetes mellitus with diabetic neuropathy, unspecified: Secondary | ICD-10-CM | POA: Diagnosis not present

## 2018-03-21 DIAGNOSIS — N183 Chronic kidney disease, stage 3 (moderate): Secondary | ICD-10-CM | POA: Diagnosis not present

## 2018-04-11 ENCOUNTER — Telehealth: Payer: Self-pay | Admitting: Diagnostic Neuroimaging

## 2018-04-11 NOTE — Telephone Encounter (Signed)
Pt's daughter Janett Billow states pt's tremors are worse, she constantly fidgits with her hand, she is having some incontinence, not very many good days. An appt has been scheduled with Amy for 2/28.  FYI

## 2018-04-11 NOTE — Telephone Encounter (Signed)
Noted/fim 

## 2018-04-12 NOTE — Progress Notes (Addendum)
PATIENT: Deanna Fisher DOB: [birth year]  REASON FOR VISIT: follow up HISTORY FROM: patient  Chief Complaint  Patient presents with  . Memory Loss    Rm. 5.      HISTORY OF PRESENT ILLNESS: Today 04/13/18 Deanna Fisher is a 68 y.o. female here today for follow up of memory loss and tremor.  Overall Deanna Fisher seems to be doing fairly well.  Her family is present with her today and reports that she is tolerating medications.  Unfortunately she does continue to have hallucinations.  Her husband reports that she is seeing children playing in her home.  He denies any violent behavior.  Her daughter reports that she has a significant tremor, noticed more with activity.  She is constantly moving her hands.  The family reports that she is having more bad days than good.  She does seem to have more trouble with word recall.  She is occasionally having urinary incontinence.  They are working very closely with primary care for management of her depression.  They have recently started a trial of citalopram 10 mg and Effexor 75 mg daily.  She seems to be tolerating this well.  She is taking Namenda 10mg  BID and Risperdal 0.25mg  BID.     HISTORY: (copied from Dr Gladstone Lighter note on 09/29/2017) UPDATE (09/29/17, VRP): Since last visit, doing poorly. Symptoms are significant, with threatening hallucinations since June 2019. Now on risperidal. Last few weeks, more sedation with increased risperidal dose. Now on twice a day dosing and feels better. No alleviating or aggravating factors. Tolerating memantine.    UPDATE (01/13/17, VRP): Since last visit, doing well. Tolerating meds. No alleviating or aggravating factors. Overall stable. Slightly better with mood and alertness and depression.  PRIOR HPI (11/02/16, VRP): 68 year old female here for evaluation of memory problems. For past 6-12 months patient has had onset of short-term memory problems, confusion, getting lost driving, increasing depression. She  is more emotional. Patient staying home more. She's not taking her medications properly. Patient forgetting status of her parents (who are deceased) but asking her own family if they're still alive. 10/05/16 patient had a stressful day taking care of some grandchildren, and when her husband came home she was significantly withdrawn, verbally unresponsive, holding her hands and shaking. She was taken to the hospital for evaluation ([facility], [place]). By the time patient came to the hospital she was returning back to baseline. CT scan and basic lab testing were unremarkable. Patient was discharged and recommended to follow-up in outpatient neurology clinic.  REVIEW OF SYSTEMS: Out of a complete 14 system review of symptoms, the patient complains only of the following symptoms, fatigue, excessive eating, runny nose, activity change and all other reviewed systems are negative.  ALLERGIES: No Known Allergies  HOME MEDICATIONS: Outpatient Medications Prior to Visit  Medication Sig Dispense Refill  . amlodipine-atorvastatin (CADUET) 10-10 MG tablet Take 1 tablet by mouth daily.    Marland Kitchen aspirin EC 81 MG tablet Take 81 mg by mouth daily.    . citalopram (CELEXA) 20 MG tablet Take 20 mg by mouth daily.    Marland Kitchen losartan (COZAAR) 100 MG tablet Take 100 mg by mouth daily.    . memantine (NAMENDA) 10 MG tablet TAKE 1 TABLET BY MOUTH TWICE DAILY 60 tablet 4  . metoprolol tartrate (LOPRESSOR) 25 MG tablet Take 25 mg by mouth daily.     . risperiDONE (RISPERDAL) 0.25 MG tablet Take 0.25 mg by mouth at bedtime.    Marland Kitchen  SIMVASTATIN PO Take by mouth.    . sitaGLIPtin-metformin (JANUMET) 50-1000 MG tablet Take 1 tablet by mouth daily.     Marland Kitchen UNABLE TO FIND daily. Med Name: glimepiride     . venlafaxine XR (EFFEXOR-XR) 75 MG 24 hr capsule Take 75 mg by mouth daily.     No facility-administered medications prior to visit.     PAST MEDICAL HISTORY: Past Medical History:  Diagnosis Date  . CVA (cerebral vascular accident)  ([place]) 03/2007  . Diabetes ([place])    type 2  . HTN (hypertension)   . Hyperlipidemia   . Hypothyroid   . Memory loss   . Struma ovarii 2005    PAST SURGICAL HISTORY: Past Surgical History:  Procedure Laterality Date  . CESAREAN SECTION    . peritoneal tumor resection    . TOTAL ABDOMINAL HYSTERECTOMY W/ BILATERAL SALPINGOOPHORECTOMY  2005    FAMILY HISTORY: Family History  Problem Relation Age of Onset  . Cancer Mother        thyroid  . Dementia Mother   . Cancer Father        leukemia  . Other Sister        wagoner's disease  . Cystic fibrosis Sister     SOCIAL HISTORY: Social History   Socioeconomic History  . Marital status: Married    Spouse name: Not on file  . Number of children: 4  . Years of education: 8  . Highest education level: Not on file  Occupational History    Comment: NA  Social Needs  . Financial resource strain: Not on file  . Food insecurity:    Worry: Not on file    Inability: Not on file  . Transportation needs:    Medical: Not on file    Non-medical: Not on file  Tobacco Use  . Smoking status: Never Smoker  . Smokeless tobacco: Never Used  Substance and Sexual Activity  . Alcohol use: No  . Drug use: No  . Sexual activity: Not on file  Lifestyle  . Physical activity:    Days per week: Not on file    Minutes per session: Not on file  . Stress: Not on file  Relationships  . Social connections:    Talks on phone: Not on file    Gets together: Not on file    Attends religious service: Not on file    Active member of club or organization: Not on file    Attends meetings of clubs or organizations: Not on file    Relationship status: Not on file  . Intimate partner violence:    Fear of current or ex partner: Not on file    Emotionally abused: Not on file    Physically abused: Not on file    Forced sexual activity: Not on file  Other Topics Concern  . Not on file  Social History Narrative   Lives with husband   Caffeine- 1-2  tea       PHYSICAL EXAM  Vitals:   04/13/18 0946  BP: 133/84  Pulse: 88  Resp: 18  Weight: 227 lb (103 kg)  Height: 5\' 2"  (1.575 m)   Body mass index is 41.52 kg/m.  Generalized: Well developed, in no acute distress  Cardiology: normal rate and rhythm, no murmur noted Neurological examination  Mentation: Alert oriented to time, place, history taking. Follows all commands speech and language fluent Cranial nerve II-XII: Pupils were equal round reactive to light. Extraocular movements were full,  visual field were full on confrontational test. Facial sensation and strength were normal. Uvula tongue midline. Head turning and shoulder shrug  were normal and symmetric. Motor: The motor testing reveals 5 over 5 strength of all 4 extremities. Good symmetric motor tone is noted throughout. Bradykinesia noted with finger taps, dysrhythmic toe taps Sensory: Sensory testing is intact to soft touch on all 4 extremities. No evidence of extinction is noted.  Coordination: Cerebellar testing reveals mild ataxia with finger-nose-finger bilaterally.  Gait and station: Gait is narrow and slow, decreased arm swing     DIAGNOSTIC DATA (LABS, IMAGING, TESTING) - I reviewed patient records, labs, notes, testing and imaging myself where available.  MMSE - Mini Mental State Exam 04/13/2018 09/29/2017 01/13/2017  Orientation to time 1 5 4   Orientation to Place 2 4 2   Registration 3 3 3   Attention/ Calculation 0 1 1  Recall 0 0 0  Language- name 2 objects 2 2 2   Language- repeat 0 0 1  Language- follow 3 step command 3 3 3   Language- read & follow direction 1 1 1   Write a sentence 0 1 1  Copy design 1 1 1   Total score 13 21 19      No results found for: WBC, HGB, HCT, MCV, PLT No results found for: NA, K, CL, CO2, GLUCOSE, BUN, CREATININE, CALCIUM, PROT, ALBUMIN, AST, ALT, ALKPHOS, BILITOT, GFRNONAA, GFRAA No results found for: CHOL, HDL, LDLCALC, LDLDIRECT, TRIG, CHOLHDL No results found for:  HGBA1C Lab Results  Component Value Date   VITAMINB12 284 10/12/2016   No results found for: TSH   ASSESSMENT AND PLAN 68 y.o. year old female  has a past medical history of CVA (cerebral vascular accident) ([place]) (03/2007), Diabetes ([place]), HTN (hypertension), Hyperlipidemia, Hypothyroid, Memory loss, and Struma ovarii (2005). here with     ICD-10-CM   1. 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  She does appear to be stable from a medical standpoint.  She is tolerating medications with no apparent adverse effects.  We have discussed available resources for the family members, including home health.  I have advised that they reach out to PCP to see if there are any recommendations from a home health standpoint.  I am also more than willing to help provide resources as needed.  We have discussed the need for safety precautions including falls and injury.  We will continue current therapy with Namenda twice a day, risperidone twice a day as well as continuing management with PCP for depression.  She seems to be doing well with citalopram 10 mg and Effexor 75 mg.  I have advised family to follow-up as needed.  They were given instructions for setting up an account with my chart and advised that they can reach out anytime with concerns or questions.  Family verbalizes understanding.   No orders of the defined types were placed in this encounter.    No orders of the defined types were placed in this encounter.     I spent 15 minutes with the patient. 50% of this time was spent counseling and educating patient on plan of care and medications.    Debbora Presto, FNP-C 04/13/2018, 11:23 AM [facility] [address] [place], [place] [address] [phone]

## 2018-04-13 ENCOUNTER — Encounter: Payer: Self-pay | Admitting: Family Medicine

## 2018-04-13 ENCOUNTER — Other Ambulatory Visit: Payer: Self-pay

## 2018-04-13 ENCOUNTER — Ambulatory Visit (INDEPENDENT_AMBULATORY_CARE_PROVIDER_SITE_OTHER): Payer: Medicare Other | Admitting: Family Medicine

## 2018-04-13 VITALS — BP 133/84 | HR 88 | Resp 18 | Ht 62.0 in | Wt 227.0 lb

## 2018-04-13 DIAGNOSIS — G308 Other Alzheimer's disease: Secondary | ICD-10-CM

## 2018-04-13 DIAGNOSIS — F0281 Dementia in other diseases classified elsewhere with behavioral disturbance: Secondary | ICD-10-CM | POA: Diagnosis not present

## 2018-04-13 DIAGNOSIS — F0280 Dementia in other diseases classified elsewhere without behavioral disturbance: Secondary | ICD-10-CM | POA: Insufficient documentation

## 2018-04-13 DIAGNOSIS — G309 Alzheimer's disease, unspecified: Secondary | ICD-10-CM

## 2018-04-13 NOTE — Progress Notes (Signed)
I reviewed note and agree with plan.   Penni Bombard, MD 07/25/6433, 3:91 PM Certified in Neurology, Neurophysiology and Neuroimaging  [facility] [address] [place], [place] [address] [phone] E

## 2018-04-13 NOTE — Patient Instructions (Addendum)
Continue Namenda 10mg  twice daily   Continue risperidone 0.25 twice daily  She seems to be doing well with citalopram 10mg  and Effexor 75mg  as prescribed by Dr Forde Dandy. Continue this therapy as directed by PCP.     Alzheimer Disease Caregiver Guide  Alzheimer disease causes a person to lose the ability to remember things and make decisions. A person who has Alzheimer disease may not be able to take care of himself or herself. He or she may need help with simple tasks. The tips below can help you care for the person. What kind of changes does this condition cause? This condition makes a person:  Forget things.  Feel confused.  Act differently.  Have different moods. These things get worse with time. Tips to help with symptoms  Be calm and patient.  Respond with a simple, short answer.  Avoid correcting the person in a negative way.  Try not to take things personally, even if the person forgets your name.  Do not argue with the person. This may make the person more upset. Tips to lessen frustration  Make appointments and do daily tasks when the person is at his or her best.  Take your time. Simple tasks may take longer. Allow plenty of time to complete tasks.  Limit choices for the person.  Involve the person in what you are doing.  Keep a daily routine.  Avoid new or crowded places, if possible.  Use simple words, short sentences, and a calm voice. Only give one direction at a time.  Buy clothes and shoes that are easy to put on and take off.  Organize medicines in a pillbox for each day of the week.  Keep a calendar in a central location to remind the person of meetings or other activities.  Let people help if they offer. Take a break when needed. Tips to prevent injury  Keep floors clear. Remove rugs, magazine racks, and floor lamps.  Keep hallways well-lit.  Put a handrail and non-slip mat in the bathtub or shower.  Put childproof locks on cabinets that  have dangerous items in them. These items include medicine, alcohol, guns, toxic cleaning items, sharp tools, matches, and lighters.  Put locks on doors where the person cannot see or reach them. This helps the person to not wander out of the house and get lost.  Be prepared for emergencies. Keep a list of emergency phone numbers and addresses close by.  Bracelets may be worn that track location and identify the person as having memory problems. This should be worn at all times for safety. Tips for the future  Discuss financial and legal planning early. People with this disease have trouble managing their money as the disease gets worse. Get help from a professional.  Talk about advance directives, safety, and daily care. Take these steps: ? Create a living will and choose a power of attorney. This is someone who can make decisions for the person with Alzheimer disease when he or she can no longer do so. ? Discuss driving safety and when to stop driving. The person's doctor can help with this. ? If the person lives alone, make sure he or she is safe. Some people need extra help at home. Other people need more care at a nursing home or care center. Where to find support You can find support by joining a support group near you. Some benefits of joining a support group include:  Learning ways to manage stress.  Sharing experiences  with others.  Getting emotional comfort and support.  Learning about caregiving as the disease progresses.  Knowing what community resources are available and making use of them. Where to find more information  Al[employer]: [employer] Contact a doctor if:  The person has a fever.  The person has a sudden behavior change that does not get better with calming strategies.  The person is not able to take care of himself or herself at home.  The person threatens you or anyone else, including himself or herself.  You are no longer able to care  for the person. Summary  Alzheimer disease causes a person to forget things and to be confused.  A person who has this condition may not be able to take care of himself or herself.  Take steps to keep the person from getting hurt. Plan for future care.  You can find support by joining a support group near you. This information is not intended to replace advice given to you by your health care provider. Make sure you discuss any questions you have with your health care provider. Document Released: 04/25/2011 Document Revised: 01/26/2017 Document Reviewed: 01/26/2017 Elsevier Interactive Patient Education  2019 [employer].   Risperidone tablets What is this medicine? RISPERIDONE (ris PER i done) is an antipsychotic. It is used to treat schizophrenia, bipolar disorder, and some symptoms of autism. This medicine may be used for other purposes; ask your health care provider or pharmacist if you have questions. COMMON BRAND NAME(S): Risperdal What should I tell my health care provider before I take this medicine? They need to know if you have any of these conditions: -dehydration -dementia -diabetes -difficulty swallowing -heart disease -history of breast cancer -history of stroke -irregular heartbeat -kidney disease -liver disease -low blood counts, like low white cell, platelet, or red cell counts -low blood pressure -Parkinson's disease -seizures -an unusual or allergic reaction to risperidone, paliperidone, other medicines, foods, dyes, or preservatives -pregnant or trying to get pregnant -breast-feeding How should I use this medicine? Take this medicine by mouth with a glass of water. Follow the directions on the prescription label. You can take it with or without food. If it upsets your stomach, take it with food. Take your medicine at regular intervals. Do not take it more often than directed. Do not stop taking except on your doctor's advice. Talk to your pediatrician  regarding the use of this medicine in children. While this drug may be prescribed for children as young as 31 years of age for selected conditions, precautions do apply. Overdosage: If you think you have taken too much of this medicine contact a poison control center or emergency room at once. NOTE: This medicine is only for you. Do not share this medicine with others. What if I miss a dose? If you miss a dose, take it as soon as you can. If it is almost time for your next dose, take only that dose. Do not take double or extra doses. What may interact with this medicine? Do not take this medicine with any of the following medications: -cisapride -dextromethorphan; quinidine -dofetilide -dronedarone -metoclopramide -pimozide -quinidine -thioridazine This medicine may also interact with the following medications: -alcohol -certain medicines for anxiety or sleep -certain medicines for blood pressure -certain medicines for fungal infections like fluconazole, itraconazole, ketoconazole, posaconazole. and voriconazole -certain medicines for seizures like carbamazepine, phenobarbital, and phenytoin -fluoxetine -levodopa -other medicines that prolong the QT interval (cause an abnormal heart rhythm) -paroxetine -rifampin This list may not  describe all possible interactions. Give your health care provider a list of all the medicines, herbs, non-prescription drugs, or dietary supplements you use. Also tell them if you smoke, drink alcohol, or use illegal drugs. Some items may interact with your medicine. What should I watch for while using this medicine? Tell your doctor or healthcare professional if your symptoms do not start to get better or if they get worse. Visit your doctor or healthcare professional for regular checks on your progress. It may be several weeks before you see the full effects of this medicine. You may get dizzy or drowsy. Do not drive, use machinery, or do anything that needs  mental alertness until you know how this medicine affects you. Do not stand or sit up quickly, especially if you are an older patient. This reduces the risk of dizzy or fainting spells. Alcohol can increase dizziness and drowsiness. Avoid alcoholic drinks. This medicine can reduce the response of your body to heat or cold. Dress warm in cold weather and stay hydrated in hot weather. If possible, avoid extreme temperatures like saunas, hot tubs, very hot or cold showers, or activities that can cause dehydration such as vigorous exercise. If you notice an increased hunger or thirst, different from your normal hunger or thirst, or if you find that you have to urinate more frequently, you should contact your health care provider as soon as possible. You may need to have your blood sugar monitored. This medicine may cause changes in your blood sugar levels. You should monitor you blood sugar frequently if you have diabetes. What side effects may I notice from receiving this medicine? Side effects that you should report to your doctor or health care professional as soon as possible: -allergic reactions like skin rash, itching or hives, swelling of the face, lips, or tongue -abnormal production of milk -breast enlargement in both males and females -breathing problems -changes in emotions or moods -difficulty moving, slow movements, tremor -fever or chills, sore throat -males: prolonged, painful erection -missed or irregular menstrual periods -muscle pain, spasms -problems with balance, talking, walking -restlessness, pacing, inability to keep still -seizures -signs and symptoms of a dangerous change in heartbeat or heart rhythm like chest pain; dizziness; fast or irregular heartbeat; palpitations; feeling faint or lightheaded, falls; breathing problems -signs and symptoms of high blood sugar such as dizziness; dry mouth; dry skin; fruity breath; nausea; stomach pain; increased hunger or thirst; increased  urination -signs and symptoms of low blood pressure like dizziness; feeling faint or lightheaded, falls; unusually weak or tired -signs and symptoms of neuroleptic malignant syndrome such as confusion; fast or irregular heart beat; high fever; increased sweating; stiff muscles -signs and symptoms of tardive dyskinesia such as uncontrollable head, neck, arm, or leg movements -sudden numbness or weakness of the face, arm, or leg -tremor -trouble swallowing Side effects that usually do not require medical attention (report to your doctor or health care professional if they continue or are bothersome): -constipation -dry mouth -drowsiness -tiredness -trouble sleeping -upset stomach -weight gain This list may not describe all possible side effects. Call your doctor for medical advice about side effects. You may report side effects to FDA at 1-800-FDA-1088. Where should I keep my medicine? Keep out of the reach of children. Store at room temperature between 15 and 25 degrees C (59 and 77 degrees F). Protect from light. Throw away any unused medicine after the expiration date. NOTE: This sheet is a summary. It may not cover all possible information.  If you have questions about this medicine, talk to your doctor, pharmacist, or health care provider.  2019 Elsevier/Gold Standard (2016-11-02 23:39:39)

## 2018-05-08 DIAGNOSIS — Z79899 Other long term (current) drug therapy: Secondary | ICD-10-CM | POA: Diagnosis not present

## 2018-05-08 DIAGNOSIS — F0390 Unspecified dementia without behavioral disturbance: Secondary | ICD-10-CM | POA: Diagnosis not present

## 2018-05-08 DIAGNOSIS — T50901A Poisoning by unspecified drugs, medicaments and biological substances, accidental (unintentional), initial encounter: Secondary | ICD-10-CM | POA: Diagnosis not present

## 2018-05-08 DIAGNOSIS — T50991A Poisoning by other drugs, medicaments and biological substances, accidental (unintentional), initial encounter: Secondary | ICD-10-CM | POA: Diagnosis not present

## 2018-05-08 DIAGNOSIS — E119 Type 2 diabetes mellitus without complications: Secondary | ICD-10-CM | POA: Diagnosis not present

## 2018-05-08 DIAGNOSIS — I10 Essential (primary) hypertension: Secondary | ICD-10-CM | POA: Diagnosis not present

## 2018-05-09 DIAGNOSIS — F0280 Dementia in other diseases classified elsewhere without behavioral disturbance: Secondary | ICD-10-CM | POA: Diagnosis not present

## 2018-05-09 DIAGNOSIS — I10 Essential (primary) hypertension: Secondary | ICD-10-CM | POA: Diagnosis not present

## 2018-05-09 DIAGNOSIS — F418 Other specified anxiety disorders: Secondary | ICD-10-CM | POA: Diagnosis not present

## 2018-05-09 DIAGNOSIS — Z79899 Other long term (current) drug therapy: Secondary | ICD-10-CM | POA: Diagnosis not present

## 2018-05-09 DIAGNOSIS — T383X1A Poisoning by insulin and oral hypoglycemic [antidiabetic] drugs, accidental (unintentional), initial encounter: Secondary | ICD-10-CM | POA: Diagnosis not present

## 2018-05-09 DIAGNOSIS — T50901A Poisoning by unspecified drugs, medicaments and biological substances, accidental (unintentional), initial encounter: Secondary | ICD-10-CM | POA: Diagnosis not present

## 2018-05-09 DIAGNOSIS — E782 Mixed hyperlipidemia: Secondary | ICD-10-CM | POA: Diagnosis not present

## 2018-05-09 DIAGNOSIS — Z7984 Long term (current) use of oral hypoglycemic drugs: Secondary | ICD-10-CM | POA: Diagnosis not present

## 2018-05-09 DIAGNOSIS — E119 Type 2 diabetes mellitus without complications: Secondary | ICD-10-CM | POA: Diagnosis not present

## 2018-05-09 DIAGNOSIS — Z7982 Long term (current) use of aspirin: Secondary | ICD-10-CM | POA: Diagnosis not present

## 2018-05-09 DIAGNOSIS — G3109 Other frontotemporal dementia: Secondary | ICD-10-CM | POA: Diagnosis not present

## 2018-06-15 ENCOUNTER — Ambulatory Visit: Payer: Medicare Other | Admitting: Diagnostic Neuroimaging

## 2018-07-30 ENCOUNTER — Telehealth: Payer: Self-pay | Admitting: *Deleted

## 2018-07-30 MED ORDER — MEMANTINE HCL 10 MG PO TABS: 10.0000 mg | ORAL_TABLET | Freq: Two times a day (BID) | ORAL | 1 refills | Status: DC

## 2018-07-30 NOTE — Telephone Encounter (Signed)
Received fax refill request for memantine from [facility], [place]. Refilled x 2 months with note to pharmacy: future refills through PCP.

## 2018-08-16 DIAGNOSIS — E538 Deficiency of other specified B group vitamins: Secondary | ICD-10-CM | POA: Diagnosis not present

## 2018-08-16 DIAGNOSIS — E1140 Type 2 diabetes mellitus with diabetic neuropathy, unspecified: Secondary | ICD-10-CM | POA: Diagnosis not present

## 2018-08-16 DIAGNOSIS — F0390 Unspecified dementia without behavioral disturbance: Secondary | ICD-10-CM | POA: Diagnosis not present

## 2018-08-16 DIAGNOSIS — F329 Major depressive disorder, single episode, unspecified: Secondary | ICD-10-CM | POA: Diagnosis not present

## 2018-08-16 DIAGNOSIS — E1142 Type 2 diabetes mellitus with diabetic polyneuropathy: Secondary | ICD-10-CM | POA: Diagnosis not present

## 2018-08-16 DIAGNOSIS — R269 Unspecified abnormalities of gait and mobility: Secondary | ICD-10-CM | POA: Diagnosis not present

## 2018-08-16 DIAGNOSIS — N183 Chronic kidney disease, stage 3 (moderate): Secondary | ICD-10-CM | POA: Diagnosis not present

## 2018-08-16 DIAGNOSIS — I639 Cerebral infarction, unspecified: Secondary | ICD-10-CM | POA: Diagnosis not present

## 2018-08-16 DIAGNOSIS — E039 Hypothyroidism, unspecified: Secondary | ICD-10-CM | POA: Diagnosis not present

## 2018-08-20 DIAGNOSIS — E538 Deficiency of other specified B group vitamins: Secondary | ICD-10-CM | POA: Diagnosis not present

## 2018-08-20 DIAGNOSIS — E038 Other specified hypothyroidism: Secondary | ICD-10-CM | POA: Diagnosis not present

## 2018-08-20 DIAGNOSIS — E1140 Type 2 diabetes mellitus with diabetic neuropathy, unspecified: Secondary | ICD-10-CM | POA: Diagnosis not present

## 2018-08-20 DIAGNOSIS — I10 Essential (primary) hypertension: Secondary | ICD-10-CM | POA: Diagnosis not present

## 2018-08-20 DIAGNOSIS — E7849 Other hyperlipidemia: Secondary | ICD-10-CM | POA: Diagnosis not present

## 2018-10-17 ENCOUNTER — Telehealth: Payer: Self-pay | Admitting: Diagnostic Neuroimaging

## 2018-10-17 NOTE — Telephone Encounter (Signed)
Pt's daughter Deanna Fisher called stating that the pharmacy sent a request several times for a refill on the pt's memantine (NAMENDA) 10 MG tablet  Ph[facility]acy is [facility] in [place] Pt's daughter also would like to know if the prescription can be written for a 60 day supply. Please advise.

## 2018-10-18 MED ORDER — MEMANTINE HCL 10 MG PO TABS: 10.0000 mg | ORAL_TABLET | Freq: Two times a day (BID) | ORAL | 1 refills | Status: AC

## 2018-10-18 NOTE — Telephone Encounter (Signed)
Namenda refilled x 2 months as requested with note to pharmacy< future refills through PCP or schedule a follow up.

## 2018-11-26 ENCOUNTER — Other Ambulatory Visit: Payer: Self-pay | Admitting: Diagnostic Neuroimaging

## 2018-11-28 ENCOUNTER — Telehealth: Payer: Self-pay | Admitting: Diagnostic Neuroimaging

## 2018-12-19 NOTE — Telephone Encounter (Addendum)
Called daughter, [name] and advised her Dr Leta Baptist would be happy to continue to refill namenda if her mother can be seen once a year. [name] stated that Dr Leta Baptist explained in her last FU that unless family wants, there's no need to FU here. She may FU with her PCP. [name] has to drive 3 hours to take mother ppointments. She stated that she has made numerous calls to PCP office and pharmacy has sent several requests for refills. Her calls are not returned and refill not sent in. [name] stated she doesn't want to have to bring patient back here if at all possible. I suggested she call and insist on speaking with nursing or office manager. [name] stated she would do that. I advised she call us back for any problems. She verbalized understanding, appreciation.

## 2018-12-19 NOTE — Telephone Encounter (Signed)
Pts daughter Tyson Alias (on [place]) is calling in and stating that she has been trying to get the refill through Dr Forde Dandy but no one in the office is responding to the pharmacy or returning calls and she is wanting to know what can be done so patient can get her refills  Work # [phone]  Cell# [phone]

## 2018-12-26 DIAGNOSIS — Z1331 Encounter for screening for depression: Secondary | ICD-10-CM | POA: Diagnosis not present

## 2018-12-26 DIAGNOSIS — N1832 Chronic kidney disease, stage 3b: Secondary | ICD-10-CM | POA: Diagnosis not present

## 2018-12-26 DIAGNOSIS — I10 Essential (primary) hypertension: Secondary | ICD-10-CM | POA: Diagnosis not present

## 2018-12-26 DIAGNOSIS — E1140 Type 2 diabetes mellitus with diabetic neuropathy, unspecified: Secondary | ICD-10-CM | POA: Diagnosis not present

## 2018-12-26 DIAGNOSIS — I639 Cerebral infarction, unspecified: Secondary | ICD-10-CM | POA: Diagnosis not present

## 2018-12-26 DIAGNOSIS — F0390 Unspecified dementia without behavioral disturbance: Secondary | ICD-10-CM | POA: Diagnosis not present

## 2018-12-26 DIAGNOSIS — E1142 Type 2 diabetes mellitus with diabetic polyneuropathy: Secondary | ICD-10-CM | POA: Diagnosis not present

## 2018-12-26 DIAGNOSIS — R269 Unspecified abnormalities of gait and mobility: Secondary | ICD-10-CM | POA: Diagnosis not present

## 2018-12-26 DIAGNOSIS — E039 Hypothyroidism, unspecified: Secondary | ICD-10-CM | POA: Diagnosis not present

## 2018-12-26 DIAGNOSIS — E785 Hyperlipidemia, unspecified: Secondary | ICD-10-CM | POA: Diagnosis not present

## 2018-12-26 DIAGNOSIS — E538 Deficiency of other specified B group vitamins: Secondary | ICD-10-CM | POA: Diagnosis not present

## 2018-12-26 DIAGNOSIS — F329 Major depressive disorder, single episode, unspecified: Secondary | ICD-10-CM | POA: Diagnosis not present

## 2019-02-28 DIAGNOSIS — Z23 Encounter for immunization: Secondary | ICD-10-CM | POA: Diagnosis not present

## 2019-02-28 DIAGNOSIS — Z8673 Personal history of transient ischemic attack (TIA), and cerebral infarction without residual deficits: Secondary | ICD-10-CM | POA: Diagnosis not present

## 2019-02-28 DIAGNOSIS — F0390 Unspecified dementia without behavioral disturbance: Secondary | ICD-10-CM | POA: Diagnosis not present

## 2019-04-17 DIAGNOSIS — I639 Cerebral infarction, unspecified: Secondary | ICD-10-CM | POA: Diagnosis not present

## 2019-04-17 DIAGNOSIS — F0390 Unspecified dementia without behavioral disturbance: Secondary | ICD-10-CM | POA: Diagnosis not present

## 2019-04-17 DIAGNOSIS — E1142 Type 2 diabetes mellitus with diabetic polyneuropathy: Secondary | ICD-10-CM | POA: Diagnosis not present

## 2019-04-17 DIAGNOSIS — R269 Unspecified abnormalities of gait and mobility: Secondary | ICD-10-CM | POA: Diagnosis not present

## 2019-04-17 DIAGNOSIS — E039 Hypothyroidism, unspecified: Secondary | ICD-10-CM | POA: Diagnosis not present

## 2019-04-17 DIAGNOSIS — I10 Essential (primary) hypertension: Secondary | ICD-10-CM | POA: Diagnosis not present

## 2019-04-17 DIAGNOSIS — N1831 Chronic kidney disease, stage 3a: Secondary | ICD-10-CM | POA: Diagnosis not present

## 2019-04-17 DIAGNOSIS — N3281 Overactive bladder: Secondary | ICD-10-CM | POA: Diagnosis not present

## 2019-04-17 DIAGNOSIS — E1140 Type 2 diabetes mellitus with diabetic neuropathy, unspecified: Secondary | ICD-10-CM | POA: Diagnosis not present

## 2019-05-21 DIAGNOSIS — R269 Unspecified abnormalities of gait and mobility: Secondary | ICD-10-CM | POA: Diagnosis not present

## 2019-05-21 DIAGNOSIS — E039 Hypothyroidism, unspecified: Secondary | ICD-10-CM | POA: Diagnosis not present

## 2019-05-21 DIAGNOSIS — F0390 Unspecified dementia without behavioral disturbance: Secondary | ICD-10-CM | POA: Diagnosis not present

## 2019-05-21 DIAGNOSIS — E1142 Type 2 diabetes mellitus with diabetic polyneuropathy: Secondary | ICD-10-CM | POA: Diagnosis not present

## 2019-05-21 DIAGNOSIS — Z1389 Encounter for screening for other disorder: Secondary | ICD-10-CM | POA: Diagnosis not present

## 2019-05-21 DIAGNOSIS — I10 Essential (primary) hypertension: Secondary | ICD-10-CM | POA: Diagnosis not present

## 2019-05-21 DIAGNOSIS — N1831 Chronic kidney disease, stage 3a: Secondary | ICD-10-CM | POA: Diagnosis not present

## 2019-05-21 DIAGNOSIS — E538 Deficiency of other specified B group vitamins: Secondary | ICD-10-CM | POA: Diagnosis not present

## 2019-05-21 DIAGNOSIS — I639 Cerebral infarction, unspecified: Secondary | ICD-10-CM | POA: Diagnosis not present

## 2019-05-21 DIAGNOSIS — E7849 Other hyperlipidemia: Secondary | ICD-10-CM | POA: Diagnosis not present

## 2019-05-21 DIAGNOSIS — E1140 Type 2 diabetes mellitus with diabetic neuropathy, unspecified: Secondary | ICD-10-CM | POA: Diagnosis not present

## 2019-05-21 DIAGNOSIS — F329 Major depressive disorder, single episode, unspecified: Secondary | ICD-10-CM | POA: Diagnosis not present

## 2019-08-22 DIAGNOSIS — I10 Essential (primary) hypertension: Secondary | ICD-10-CM | POA: Diagnosis not present

## 2019-08-22 DIAGNOSIS — N1831 Chronic kidney disease, stage 3a: Secondary | ICD-10-CM | POA: Diagnosis not present

## 2019-08-22 DIAGNOSIS — F0390 Unspecified dementia without behavioral disturbance: Secondary | ICD-10-CM | POA: Diagnosis not present

## 2019-08-22 DIAGNOSIS — E785 Hyperlipidemia, unspecified: Secondary | ICD-10-CM | POA: Diagnosis not present

## 2019-08-22 DIAGNOSIS — F329 Major depressive disorder, single episode, unspecified: Secondary | ICD-10-CM | POA: Diagnosis not present

## 2019-08-22 DIAGNOSIS — E538 Deficiency of other specified B group vitamins: Secondary | ICD-10-CM | POA: Diagnosis not present

## 2019-08-22 DIAGNOSIS — E1142 Type 2 diabetes mellitus with diabetic polyneuropathy: Secondary | ICD-10-CM | POA: Diagnosis not present

## 2019-08-22 DIAGNOSIS — R269 Unspecified abnormalities of gait and mobility: Secondary | ICD-10-CM | POA: Diagnosis not present

## 2019-08-22 DIAGNOSIS — E039 Hypothyroidism, unspecified: Secondary | ICD-10-CM | POA: Diagnosis not present

## 2019-08-22 DIAGNOSIS — E1140 Type 2 diabetes mellitus with diabetic neuropathy, unspecified: Secondary | ICD-10-CM | POA: Diagnosis not present

## 2019-08-22 DIAGNOSIS — I639 Cerebral infarction, unspecified: Secondary | ICD-10-CM | POA: Diagnosis not present

## 2019-12-23 DIAGNOSIS — F0390 Unspecified dementia without behavioral disturbance: Secondary | ICD-10-CM | POA: Diagnosis not present

## 2019-12-23 DIAGNOSIS — F329 Major depressive disorder, single episode, unspecified: Secondary | ICD-10-CM | POA: Diagnosis not present

## 2019-12-23 DIAGNOSIS — E785 Hyperlipidemia, unspecified: Secondary | ICD-10-CM | POA: Diagnosis not present

## 2019-12-23 DIAGNOSIS — I639 Cerebral infarction, unspecified: Secondary | ICD-10-CM | POA: Diagnosis not present

## 2019-12-23 DIAGNOSIS — R269 Unspecified abnormalities of gait and mobility: Secondary | ICD-10-CM | POA: Diagnosis not present

## 2019-12-23 DIAGNOSIS — E1142 Type 2 diabetes mellitus with diabetic polyneuropathy: Secondary | ICD-10-CM | POA: Diagnosis not present

## 2019-12-23 DIAGNOSIS — E039 Hypothyroidism, unspecified: Secondary | ICD-10-CM | POA: Diagnosis not present

## 2019-12-23 DIAGNOSIS — E538 Deficiency of other specified B group vitamins: Secondary | ICD-10-CM | POA: Diagnosis not present

## 2019-12-23 DIAGNOSIS — I10 Essential (primary) hypertension: Secondary | ICD-10-CM | POA: Diagnosis not present

## 2019-12-23 DIAGNOSIS — N1831 Chronic kidney disease, stage 3a: Secondary | ICD-10-CM | POA: Diagnosis not present

## 2019-12-23 DIAGNOSIS — Z23 Encounter for immunization: Secondary | ICD-10-CM | POA: Diagnosis not present

## 2020-03-24 DIAGNOSIS — L97512 Non-pressure chronic ulcer of other part of right foot with fat layer exposed: Secondary | ICD-10-CM | POA: Diagnosis not present

## 2020-03-24 DIAGNOSIS — E1142 Type 2 diabetes mellitus with diabetic polyneuropathy: Secondary | ICD-10-CM | POA: Diagnosis not present

## 2020-03-24 DIAGNOSIS — I739 Peripheral vascular disease, unspecified: Secondary | ICD-10-CM | POA: Diagnosis not present

## 2020-04-06 DIAGNOSIS — L97512 Non-pressure chronic ulcer of other part of right foot with fat layer exposed: Secondary | ICD-10-CM | POA: Diagnosis not present

## 2020-04-21 DIAGNOSIS — E039 Hypothyroidism, unspecified: Secondary | ICD-10-CM | POA: Diagnosis not present

## 2020-04-21 DIAGNOSIS — F329 Major depressive disorder, single episode, unspecified: Secondary | ICD-10-CM | POA: Diagnosis not present

## 2020-04-21 DIAGNOSIS — I639 Cerebral infarction, unspecified: Secondary | ICD-10-CM | POA: Diagnosis not present

## 2020-04-21 DIAGNOSIS — R269 Unspecified abnormalities of gait and mobility: Secondary | ICD-10-CM | POA: Diagnosis not present

## 2020-04-21 DIAGNOSIS — I739 Peripheral vascular disease, unspecified: Secondary | ICD-10-CM | POA: Diagnosis not present

## 2020-04-21 DIAGNOSIS — E1142 Type 2 diabetes mellitus with diabetic polyneuropathy: Secondary | ICD-10-CM | POA: Diagnosis not present

## 2020-04-21 DIAGNOSIS — N1831 Chronic kidney disease, stage 3a: Secondary | ICD-10-CM | POA: Diagnosis not present

## 2020-04-21 DIAGNOSIS — E785 Hyperlipidemia, unspecified: Secondary | ICD-10-CM | POA: Diagnosis not present

## 2020-04-21 DIAGNOSIS — F0390 Unspecified dementia without behavioral disturbance: Secondary | ICD-10-CM | POA: Diagnosis not present

## 2020-09-25 DIAGNOSIS — I10 Essential (primary) hypertension: Secondary | ICD-10-CM | POA: Diagnosis not present

## 2020-09-25 DIAGNOSIS — E039 Hypothyroidism, unspecified: Secondary | ICD-10-CM | POA: Diagnosis not present

## 2020-09-25 DIAGNOSIS — E1142 Type 2 diabetes mellitus with diabetic polyneuropathy: Secondary | ICD-10-CM | POA: Diagnosis not present

## 2020-09-25 DIAGNOSIS — I739 Peripheral vascular disease, unspecified: Secondary | ICD-10-CM | POA: Diagnosis not present

## 2020-09-25 DIAGNOSIS — F0390 Unspecified dementia without behavioral disturbance: Secondary | ICD-10-CM | POA: Diagnosis not present

## 2020-09-25 DIAGNOSIS — F329 Major depressive disorder, single episode, unspecified: Secondary | ICD-10-CM | POA: Diagnosis not present

## 2020-09-25 DIAGNOSIS — E785 Hyperlipidemia, unspecified: Secondary | ICD-10-CM | POA: Diagnosis not present

## 2020-09-25 DIAGNOSIS — N1831 Chronic kidney disease, stage 3a: Secondary | ICD-10-CM | POA: Diagnosis not present

## 2020-09-25 DIAGNOSIS — R269 Unspecified abnormalities of gait and mobility: Secondary | ICD-10-CM | POA: Diagnosis not present

## 2020-09-25 DIAGNOSIS — I639 Cerebral infarction, unspecified: Secondary | ICD-10-CM | POA: Diagnosis not present

## 2020-12-28 DIAGNOSIS — F0390 Unspecified dementia without behavioral disturbance: Secondary | ICD-10-CM | POA: Diagnosis not present

## 2021-02-12 DIAGNOSIS — F0390 Unspecified dementia without behavioral disturbance: Secondary | ICD-10-CM | POA: Diagnosis not present

## 2021-03-02 DIAGNOSIS — I739 Peripheral vascular disease, unspecified: Secondary | ICD-10-CM | POA: Diagnosis not present

## 2021-03-02 DIAGNOSIS — Z8673 Personal history of transient ischemic attack (TIA), and cerebral infarction without residual deficits: Secondary | ICD-10-CM | POA: Diagnosis not present

## 2021-03-02 DIAGNOSIS — F329 Major depressive disorder, single episode, unspecified: Secondary | ICD-10-CM | POA: Diagnosis not present

## 2021-03-02 DIAGNOSIS — E1142 Type 2 diabetes mellitus with diabetic polyneuropathy: Secondary | ICD-10-CM | POA: Diagnosis not present

## 2021-03-02 DIAGNOSIS — N1831 Chronic kidney disease, stage 3a: Secondary | ICD-10-CM | POA: Diagnosis not present

## 2021-03-02 DIAGNOSIS — F0280 Dementia in other diseases classified elsewhere without behavioral disturbance: Secondary | ICD-10-CM | POA: Diagnosis not present

## 2021-03-02 DIAGNOSIS — G311 Senile degeneration of brain, not elsewhere classified: Secondary | ICD-10-CM | POA: Diagnosis not present

## 2021-03-02 DIAGNOSIS — E039 Hypothyroidism, unspecified: Secondary | ICD-10-CM | POA: Diagnosis not present

## 2021-03-02 DIAGNOSIS — R269 Unspecified abnormalities of gait and mobility: Secondary | ICD-10-CM | POA: Diagnosis not present

## 2021-03-02 DIAGNOSIS — I129 Hypertensive chronic kidney disease with stage 1 through stage 4 chronic kidney disease, or unspecified chronic kidney disease: Secondary | ICD-10-CM | POA: Diagnosis not present

## 2021-03-02 DIAGNOSIS — E785 Hyperlipidemia, unspecified: Secondary | ICD-10-CM | POA: Diagnosis not present

## 2021-03-03 DIAGNOSIS — F0280 Dementia in other diseases classified elsewhere without behavioral disturbance: Secondary | ICD-10-CM | POA: Diagnosis not present

## 2021-03-03 DIAGNOSIS — I129 Hypertensive chronic kidney disease with stage 1 through stage 4 chronic kidney disease, or unspecified chronic kidney disease: Secondary | ICD-10-CM | POA: Diagnosis not present

## 2021-03-03 DIAGNOSIS — I739 Peripheral vascular disease, unspecified: Secondary | ICD-10-CM | POA: Diagnosis not present

## 2021-03-03 DIAGNOSIS — E1142 Type 2 diabetes mellitus with diabetic polyneuropathy: Secondary | ICD-10-CM | POA: Diagnosis not present

## 2021-03-03 DIAGNOSIS — G311 Senile degeneration of brain, not elsewhere classified: Secondary | ICD-10-CM | POA: Diagnosis not present

## 2021-03-03 DIAGNOSIS — N1831 Chronic kidney disease, stage 3a: Secondary | ICD-10-CM | POA: Diagnosis not present

## 2021-03-08 DIAGNOSIS — G311 Senile degeneration of brain, not elsewhere classified: Secondary | ICD-10-CM | POA: Diagnosis not present

## 2021-03-08 DIAGNOSIS — I739 Peripheral vascular disease, unspecified: Secondary | ICD-10-CM | POA: Diagnosis not present

## 2021-03-08 DIAGNOSIS — I129 Hypertensive chronic kidney disease with stage 1 through stage 4 chronic kidney disease, or unspecified chronic kidney disease: Secondary | ICD-10-CM | POA: Diagnosis not present

## 2021-03-08 DIAGNOSIS — E1142 Type 2 diabetes mellitus with diabetic polyneuropathy: Secondary | ICD-10-CM | POA: Diagnosis not present

## 2021-03-08 DIAGNOSIS — F0280 Dementia in other diseases classified elsewhere without behavioral disturbance: Secondary | ICD-10-CM | POA: Diagnosis not present

## 2021-03-08 DIAGNOSIS — N1831 Chronic kidney disease, stage 3a: Secondary | ICD-10-CM | POA: Diagnosis not present

## 2021-03-10 DIAGNOSIS — I739 Peripheral vascular disease, unspecified: Secondary | ICD-10-CM | POA: Diagnosis not present

## 2021-03-10 DIAGNOSIS — E1142 Type 2 diabetes mellitus with diabetic polyneuropathy: Secondary | ICD-10-CM | POA: Diagnosis not present

## 2021-03-10 DIAGNOSIS — N1831 Chronic kidney disease, stage 3a: Secondary | ICD-10-CM | POA: Diagnosis not present

## 2021-03-10 DIAGNOSIS — G311 Senile degeneration of brain, not elsewhere classified: Secondary | ICD-10-CM | POA: Diagnosis not present

## 2021-03-10 DIAGNOSIS — F0280 Dementia in other diseases classified elsewhere without behavioral disturbance: Secondary | ICD-10-CM | POA: Diagnosis not present

## 2021-03-10 DIAGNOSIS — I129 Hypertensive chronic kidney disease with stage 1 through stage 4 chronic kidney disease, or unspecified chronic kidney disease: Secondary | ICD-10-CM | POA: Diagnosis not present

## 2021-03-17 DIAGNOSIS — E1142 Type 2 diabetes mellitus with diabetic polyneuropathy: Secondary | ICD-10-CM | POA: Diagnosis not present

## 2021-03-17 DIAGNOSIS — F329 Major depressive disorder, single episode, unspecified: Secondary | ICD-10-CM | POA: Diagnosis not present

## 2021-03-17 DIAGNOSIS — E785 Hyperlipidemia, unspecified: Secondary | ICD-10-CM | POA: Diagnosis not present

## 2021-03-17 DIAGNOSIS — E039 Hypothyroidism, unspecified: Secondary | ICD-10-CM | POA: Diagnosis not present

## 2021-03-17 DIAGNOSIS — N1831 Chronic kidney disease, stage 3a: Secondary | ICD-10-CM | POA: Diagnosis not present

## 2021-03-17 DIAGNOSIS — F0280 Dementia in other diseases classified elsewhere without behavioral disturbance: Secondary | ICD-10-CM | POA: Diagnosis not present

## 2021-03-17 DIAGNOSIS — R269 Unspecified abnormalities of gait and mobility: Secondary | ICD-10-CM | POA: Diagnosis not present

## 2021-03-17 DIAGNOSIS — Z8673 Personal history of transient ischemic attack (TIA), and cerebral infarction without residual deficits: Secondary | ICD-10-CM | POA: Diagnosis not present

## 2021-03-17 DIAGNOSIS — G311 Senile degeneration of brain, not elsewhere classified: Secondary | ICD-10-CM | POA: Diagnosis not present

## 2021-03-17 DIAGNOSIS — I739 Peripheral vascular disease, unspecified: Secondary | ICD-10-CM | POA: Diagnosis not present

## 2021-03-17 DIAGNOSIS — I129 Hypertensive chronic kidney disease with stage 1 through stage 4 chronic kidney disease, or unspecified chronic kidney disease: Secondary | ICD-10-CM | POA: Diagnosis not present

## 2021-03-24 DIAGNOSIS — G311 Senile degeneration of brain, not elsewhere classified: Secondary | ICD-10-CM | POA: Diagnosis not present

## 2021-03-24 DIAGNOSIS — F0280 Dementia in other diseases classified elsewhere without behavioral disturbance: Secondary | ICD-10-CM | POA: Diagnosis not present

## 2021-03-24 DIAGNOSIS — I739 Peripheral vascular disease, unspecified: Secondary | ICD-10-CM | POA: Diagnosis not present

## 2021-03-24 DIAGNOSIS — I129 Hypertensive chronic kidney disease with stage 1 through stage 4 chronic kidney disease, or unspecified chronic kidney disease: Secondary | ICD-10-CM | POA: Diagnosis not present

## 2021-03-24 DIAGNOSIS — N1831 Chronic kidney disease, stage 3a: Secondary | ICD-10-CM | POA: Diagnosis not present

## 2021-03-24 DIAGNOSIS — E1142 Type 2 diabetes mellitus with diabetic polyneuropathy: Secondary | ICD-10-CM | POA: Diagnosis not present

## 2021-03-31 DIAGNOSIS — E1142 Type 2 diabetes mellitus with diabetic polyneuropathy: Secondary | ICD-10-CM | POA: Diagnosis not present

## 2021-03-31 DIAGNOSIS — F0280 Dementia in other diseases classified elsewhere without behavioral disturbance: Secondary | ICD-10-CM | POA: Diagnosis not present

## 2021-03-31 DIAGNOSIS — I739 Peripheral vascular disease, unspecified: Secondary | ICD-10-CM | POA: Diagnosis not present

## 2021-03-31 DIAGNOSIS — I129 Hypertensive chronic kidney disease with stage 1 through stage 4 chronic kidney disease, or unspecified chronic kidney disease: Secondary | ICD-10-CM | POA: Diagnosis not present

## 2021-03-31 DIAGNOSIS — G311 Senile degeneration of brain, not elsewhere classified: Secondary | ICD-10-CM | POA: Diagnosis not present

## 2021-03-31 DIAGNOSIS — N1831 Chronic kidney disease, stage 3a: Secondary | ICD-10-CM | POA: Diagnosis not present

## 2021-04-01 DIAGNOSIS — I739 Peripheral vascular disease, unspecified: Secondary | ICD-10-CM | POA: Diagnosis not present

## 2021-04-01 DIAGNOSIS — F0280 Dementia in other diseases classified elsewhere without behavioral disturbance: Secondary | ICD-10-CM | POA: Diagnosis not present

## 2021-04-01 DIAGNOSIS — E1142 Type 2 diabetes mellitus with diabetic polyneuropathy: Secondary | ICD-10-CM | POA: Diagnosis not present

## 2021-04-01 DIAGNOSIS — I129 Hypertensive chronic kidney disease with stage 1 through stage 4 chronic kidney disease, or unspecified chronic kidney disease: Secondary | ICD-10-CM | POA: Diagnosis not present

## 2021-04-01 DIAGNOSIS — N1831 Chronic kidney disease, stage 3a: Secondary | ICD-10-CM | POA: Diagnosis not present

## 2021-04-01 DIAGNOSIS — G311 Senile degeneration of brain, not elsewhere classified: Secondary | ICD-10-CM | POA: Diagnosis not present

## 2021-04-06 DIAGNOSIS — I129 Hypertensive chronic kidney disease with stage 1 through stage 4 chronic kidney disease, or unspecified chronic kidney disease: Secondary | ICD-10-CM | POA: Diagnosis not present

## 2021-04-06 DIAGNOSIS — G311 Senile degeneration of brain, not elsewhere classified: Secondary | ICD-10-CM | POA: Diagnosis not present

## 2021-04-06 DIAGNOSIS — E1142 Type 2 diabetes mellitus with diabetic polyneuropathy: Secondary | ICD-10-CM | POA: Diagnosis not present

## 2021-04-06 DIAGNOSIS — I739 Peripheral vascular disease, unspecified: Secondary | ICD-10-CM | POA: Diagnosis not present

## 2021-04-06 DIAGNOSIS — N1831 Chronic kidney disease, stage 3a: Secondary | ICD-10-CM | POA: Diagnosis not present

## 2021-04-06 DIAGNOSIS — F0280 Dementia in other diseases classified elsewhere without behavioral disturbance: Secondary | ICD-10-CM | POA: Diagnosis not present

## 2021-04-07 DIAGNOSIS — I129 Hypertensive chronic kidney disease with stage 1 through stage 4 chronic kidney disease, or unspecified chronic kidney disease: Secondary | ICD-10-CM | POA: Diagnosis not present

## 2021-04-07 DIAGNOSIS — I739 Peripheral vascular disease, unspecified: Secondary | ICD-10-CM | POA: Diagnosis not present

## 2021-04-07 DIAGNOSIS — N1831 Chronic kidney disease, stage 3a: Secondary | ICD-10-CM | POA: Diagnosis not present

## 2021-04-07 DIAGNOSIS — G311 Senile degeneration of brain, not elsewhere classified: Secondary | ICD-10-CM | POA: Diagnosis not present

## 2021-04-07 DIAGNOSIS — F0280 Dementia in other diseases classified elsewhere without behavioral disturbance: Secondary | ICD-10-CM | POA: Diagnosis not present

## 2021-04-07 DIAGNOSIS — E1142 Type 2 diabetes mellitus with diabetic polyneuropathy: Secondary | ICD-10-CM | POA: Diagnosis not present

## 2021-04-14 DIAGNOSIS — I739 Peripheral vascular disease, unspecified: Secondary | ICD-10-CM | POA: Diagnosis not present

## 2021-04-14 DIAGNOSIS — E039 Hypothyroidism, unspecified: Secondary | ICD-10-CM | POA: Diagnosis not present

## 2021-04-14 DIAGNOSIS — R269 Unspecified abnormalities of gait and mobility: Secondary | ICD-10-CM | POA: Diagnosis not present

## 2021-04-14 DIAGNOSIS — N1831 Chronic kidney disease, stage 3a: Secondary | ICD-10-CM | POA: Diagnosis not present

## 2021-04-14 DIAGNOSIS — E1142 Type 2 diabetes mellitus with diabetic polyneuropathy: Secondary | ICD-10-CM | POA: Diagnosis not present

## 2021-04-14 DIAGNOSIS — E785 Hyperlipidemia, unspecified: Secondary | ICD-10-CM | POA: Diagnosis not present

## 2021-04-14 DIAGNOSIS — I129 Hypertensive chronic kidney disease with stage 1 through stage 4 chronic kidney disease, or unspecified chronic kidney disease: Secondary | ICD-10-CM | POA: Diagnosis not present

## 2021-04-14 DIAGNOSIS — F0280 Dementia in other diseases classified elsewhere without behavioral disturbance: Secondary | ICD-10-CM | POA: Diagnosis not present

## 2021-04-14 DIAGNOSIS — G311 Senile degeneration of brain, not elsewhere classified: Secondary | ICD-10-CM | POA: Diagnosis not present

## 2021-04-14 DIAGNOSIS — F329 Major depressive disorder, single episode, unspecified: Secondary | ICD-10-CM | POA: Diagnosis not present

## 2021-04-14 DIAGNOSIS — Z8673 Personal history of transient ischemic attack (TIA), and cerebral infarction without residual deficits: Secondary | ICD-10-CM | POA: Diagnosis not present

## 2021-04-21 DIAGNOSIS — G311 Senile degeneration of brain, not elsewhere classified: Secondary | ICD-10-CM | POA: Diagnosis not present

## 2021-04-21 DIAGNOSIS — I739 Peripheral vascular disease, unspecified: Secondary | ICD-10-CM | POA: Diagnosis not present

## 2021-04-21 DIAGNOSIS — N1831 Chronic kidney disease, stage 3a: Secondary | ICD-10-CM | POA: Diagnosis not present

## 2021-04-21 DIAGNOSIS — E1142 Type 2 diabetes mellitus with diabetic polyneuropathy: Secondary | ICD-10-CM | POA: Diagnosis not present

## 2021-04-21 DIAGNOSIS — F0280 Dementia in other diseases classified elsewhere without behavioral disturbance: Secondary | ICD-10-CM | POA: Diagnosis not present

## 2021-04-21 DIAGNOSIS — I129 Hypertensive chronic kidney disease with stage 1 through stage 4 chronic kidney disease, or unspecified chronic kidney disease: Secondary | ICD-10-CM | POA: Diagnosis not present

## 2021-04-28 DIAGNOSIS — N1831 Chronic kidney disease, stage 3a: Secondary | ICD-10-CM | POA: Diagnosis not present

## 2021-04-28 DIAGNOSIS — E1142 Type 2 diabetes mellitus with diabetic polyneuropathy: Secondary | ICD-10-CM | POA: Diagnosis not present

## 2021-04-28 DIAGNOSIS — G311 Senile degeneration of brain, not elsewhere classified: Secondary | ICD-10-CM | POA: Diagnosis not present

## 2021-04-28 DIAGNOSIS — I129 Hypertensive chronic kidney disease with stage 1 through stage 4 chronic kidney disease, or unspecified chronic kidney disease: Secondary | ICD-10-CM | POA: Diagnosis not present

## 2021-04-28 DIAGNOSIS — F0280 Dementia in other diseases classified elsewhere without behavioral disturbance: Secondary | ICD-10-CM | POA: Diagnosis not present

## 2021-04-28 DIAGNOSIS — I739 Peripheral vascular disease, unspecified: Secondary | ICD-10-CM | POA: Diagnosis not present

## 2021-05-05 DIAGNOSIS — N1831 Chronic kidney disease, stage 3a: Secondary | ICD-10-CM | POA: Diagnosis not present

## 2021-05-05 DIAGNOSIS — G311 Senile degeneration of brain, not elsewhere classified: Secondary | ICD-10-CM | POA: Diagnosis not present

## 2021-05-05 DIAGNOSIS — E1142 Type 2 diabetes mellitus with diabetic polyneuropathy: Secondary | ICD-10-CM | POA: Diagnosis not present

## 2021-05-05 DIAGNOSIS — I129 Hypertensive chronic kidney disease with stage 1 through stage 4 chronic kidney disease, or unspecified chronic kidney disease: Secondary | ICD-10-CM | POA: Diagnosis not present

## 2021-05-05 DIAGNOSIS — F0280 Dementia in other diseases classified elsewhere without behavioral disturbance: Secondary | ICD-10-CM | POA: Diagnosis not present

## 2021-05-05 DIAGNOSIS — I739 Peripheral vascular disease, unspecified: Secondary | ICD-10-CM | POA: Diagnosis not present

## 2021-05-12 DIAGNOSIS — G311 Senile degeneration of brain, not elsewhere classified: Secondary | ICD-10-CM | POA: Diagnosis not present

## 2021-05-12 DIAGNOSIS — E1142 Type 2 diabetes mellitus with diabetic polyneuropathy: Secondary | ICD-10-CM | POA: Diagnosis not present

## 2021-05-12 DIAGNOSIS — F0280 Dementia in other diseases classified elsewhere without behavioral disturbance: Secondary | ICD-10-CM | POA: Diagnosis not present

## 2021-05-12 DIAGNOSIS — N1831 Chronic kidney disease, stage 3a: Secondary | ICD-10-CM | POA: Diagnosis not present

## 2021-05-12 DIAGNOSIS — I739 Peripheral vascular disease, unspecified: Secondary | ICD-10-CM | POA: Diagnosis not present

## 2021-05-12 DIAGNOSIS — I129 Hypertensive chronic kidney disease with stage 1 through stage 4 chronic kidney disease, or unspecified chronic kidney disease: Secondary | ICD-10-CM | POA: Diagnosis not present

## 2021-05-15 DIAGNOSIS — F329 Major depressive disorder, single episode, unspecified: Secondary | ICD-10-CM | POA: Diagnosis not present

## 2021-05-15 DIAGNOSIS — E1142 Type 2 diabetes mellitus with diabetic polyneuropathy: Secondary | ICD-10-CM | POA: Diagnosis not present

## 2021-05-15 DIAGNOSIS — Z8673 Personal history of transient ischemic attack (TIA), and cerebral infarction without residual deficits: Secondary | ICD-10-CM | POA: Diagnosis not present

## 2021-05-15 DIAGNOSIS — I739 Peripheral vascular disease, unspecified: Secondary | ICD-10-CM | POA: Diagnosis not present

## 2021-05-15 DIAGNOSIS — N1831 Chronic kidney disease, stage 3a: Secondary | ICD-10-CM | POA: Diagnosis not present

## 2021-05-15 DIAGNOSIS — R269 Unspecified abnormalities of gait and mobility: Secondary | ICD-10-CM | POA: Diagnosis not present

## 2021-05-15 DIAGNOSIS — I129 Hypertensive chronic kidney disease with stage 1 through stage 4 chronic kidney disease, or unspecified chronic kidney disease: Secondary | ICD-10-CM | POA: Diagnosis not present

## 2021-05-15 DIAGNOSIS — F0280 Dementia in other diseases classified elsewhere without behavioral disturbance: Secondary | ICD-10-CM | POA: Diagnosis not present

## 2021-05-15 DIAGNOSIS — E039 Hypothyroidism, unspecified: Secondary | ICD-10-CM | POA: Diagnosis not present

## 2021-05-15 DIAGNOSIS — E785 Hyperlipidemia, unspecified: Secondary | ICD-10-CM | POA: Diagnosis not present

## 2021-05-15 DIAGNOSIS — G311 Senile degeneration of brain, not elsewhere classified: Secondary | ICD-10-CM | POA: Diagnosis not present

## 2021-05-19 DIAGNOSIS — I129 Hypertensive chronic kidney disease with stage 1 through stage 4 chronic kidney disease, or unspecified chronic kidney disease: Secondary | ICD-10-CM | POA: Diagnosis not present

## 2021-05-19 DIAGNOSIS — E1142 Type 2 diabetes mellitus with diabetic polyneuropathy: Secondary | ICD-10-CM | POA: Diagnosis not present

## 2021-05-19 DIAGNOSIS — N1831 Chronic kidney disease, stage 3a: Secondary | ICD-10-CM | POA: Diagnosis not present

## 2021-05-19 DIAGNOSIS — F0280 Dementia in other diseases classified elsewhere without behavioral disturbance: Secondary | ICD-10-CM | POA: Diagnosis not present

## 2021-05-19 DIAGNOSIS — G311 Senile degeneration of brain, not elsewhere classified: Secondary | ICD-10-CM | POA: Diagnosis not present

## 2021-05-19 DIAGNOSIS — I739 Peripheral vascular disease, unspecified: Secondary | ICD-10-CM | POA: Diagnosis not present

## 2021-05-27 DIAGNOSIS — N1831 Chronic kidney disease, stage 3a: Secondary | ICD-10-CM | POA: Diagnosis not present

## 2021-05-27 DIAGNOSIS — F0280 Dementia in other diseases classified elsewhere without behavioral disturbance: Secondary | ICD-10-CM | POA: Diagnosis not present

## 2021-05-27 DIAGNOSIS — I739 Peripheral vascular disease, unspecified: Secondary | ICD-10-CM | POA: Diagnosis not present

## 2021-05-27 DIAGNOSIS — I129 Hypertensive chronic kidney disease with stage 1 through stage 4 chronic kidney disease, or unspecified chronic kidney disease: Secondary | ICD-10-CM | POA: Diagnosis not present

## 2021-05-27 DIAGNOSIS — E1142 Type 2 diabetes mellitus with diabetic polyneuropathy: Secondary | ICD-10-CM | POA: Diagnosis not present

## 2021-05-27 DIAGNOSIS — G311 Senile degeneration of brain, not elsewhere classified: Secondary | ICD-10-CM | POA: Diagnosis not present

## 2021-05-28 DIAGNOSIS — E1142 Type 2 diabetes mellitus with diabetic polyneuropathy: Secondary | ICD-10-CM | POA: Diagnosis not present

## 2021-05-28 DIAGNOSIS — I739 Peripheral vascular disease, unspecified: Secondary | ICD-10-CM | POA: Diagnosis not present

## 2021-05-28 DIAGNOSIS — G311 Senile degeneration of brain, not elsewhere classified: Secondary | ICD-10-CM | POA: Diagnosis not present

## 2021-05-28 DIAGNOSIS — F0280 Dementia in other diseases classified elsewhere without behavioral disturbance: Secondary | ICD-10-CM | POA: Diagnosis not present

## 2021-05-28 DIAGNOSIS — I129 Hypertensive chronic kidney disease with stage 1 through stage 4 chronic kidney disease, or unspecified chronic kidney disease: Secondary | ICD-10-CM | POA: Diagnosis not present

## 2021-05-28 DIAGNOSIS — N1831 Chronic kidney disease, stage 3a: Secondary | ICD-10-CM | POA: Diagnosis not present

## 2021-06-01 DIAGNOSIS — N1831 Chronic kidney disease, stage 3a: Secondary | ICD-10-CM | POA: Diagnosis not present

## 2021-06-01 DIAGNOSIS — I129 Hypertensive chronic kidney disease with stage 1 through stage 4 chronic kidney disease, or unspecified chronic kidney disease: Secondary | ICD-10-CM | POA: Diagnosis not present

## 2021-06-01 DIAGNOSIS — F0280 Dementia in other diseases classified elsewhere without behavioral disturbance: Secondary | ICD-10-CM | POA: Diagnosis not present

## 2021-06-01 DIAGNOSIS — I739 Peripheral vascular disease, unspecified: Secondary | ICD-10-CM | POA: Diagnosis not present

## 2021-06-01 DIAGNOSIS — G311 Senile degeneration of brain, not elsewhere classified: Secondary | ICD-10-CM | POA: Diagnosis not present

## 2021-06-01 DIAGNOSIS — E1142 Type 2 diabetes mellitus with diabetic polyneuropathy: Secondary | ICD-10-CM | POA: Diagnosis not present

## 2021-06-08 DIAGNOSIS — F0280 Dementia in other diseases classified elsewhere without behavioral disturbance: Secondary | ICD-10-CM | POA: Diagnosis not present

## 2021-06-08 DIAGNOSIS — G311 Senile degeneration of brain, not elsewhere classified: Secondary | ICD-10-CM | POA: Diagnosis not present

## 2021-06-08 DIAGNOSIS — E1142 Type 2 diabetes mellitus with diabetic polyneuropathy: Secondary | ICD-10-CM | POA: Diagnosis not present

## 2021-06-08 DIAGNOSIS — N1831 Chronic kidney disease, stage 3a: Secondary | ICD-10-CM | POA: Diagnosis not present

## 2021-06-08 DIAGNOSIS — I129 Hypertensive chronic kidney disease with stage 1 through stage 4 chronic kidney disease, or unspecified chronic kidney disease: Secondary | ICD-10-CM | POA: Diagnosis not present

## 2021-06-08 DIAGNOSIS — I739 Peripheral vascular disease, unspecified: Secondary | ICD-10-CM | POA: Diagnosis not present

## 2021-06-14 DIAGNOSIS — G311 Senile degeneration of brain, not elsewhere classified: Secondary | ICD-10-CM | POA: Diagnosis not present

## 2021-06-14 DIAGNOSIS — Z8673 Personal history of transient ischemic attack (TIA), and cerebral infarction without residual deficits: Secondary | ICD-10-CM | POA: Diagnosis not present

## 2021-06-14 DIAGNOSIS — E039 Hypothyroidism, unspecified: Secondary | ICD-10-CM | POA: Diagnosis not present

## 2021-06-14 DIAGNOSIS — R269 Unspecified abnormalities of gait and mobility: Secondary | ICD-10-CM | POA: Diagnosis not present

## 2021-06-14 DIAGNOSIS — I739 Peripheral vascular disease, unspecified: Secondary | ICD-10-CM | POA: Diagnosis not present

## 2021-06-14 DIAGNOSIS — F329 Major depressive disorder, single episode, unspecified: Secondary | ICD-10-CM | POA: Diagnosis not present

## 2021-06-14 DIAGNOSIS — N1831 Chronic kidney disease, stage 3a: Secondary | ICD-10-CM | POA: Diagnosis not present

## 2021-06-14 DIAGNOSIS — F0280 Dementia in other diseases classified elsewhere without behavioral disturbance: Secondary | ICD-10-CM | POA: Diagnosis not present

## 2021-06-14 DIAGNOSIS — I129 Hypertensive chronic kidney disease with stage 1 through stage 4 chronic kidney disease, or unspecified chronic kidney disease: Secondary | ICD-10-CM | POA: Diagnosis not present

## 2021-06-14 DIAGNOSIS — E785 Hyperlipidemia, unspecified: Secondary | ICD-10-CM | POA: Diagnosis not present

## 2021-06-14 DIAGNOSIS — E1142 Type 2 diabetes mellitus with diabetic polyneuropathy: Secondary | ICD-10-CM | POA: Diagnosis not present

## 2021-06-15 DIAGNOSIS — N1831 Chronic kidney disease, stage 3a: Secondary | ICD-10-CM | POA: Diagnosis not present

## 2021-06-15 DIAGNOSIS — E1142 Type 2 diabetes mellitus with diabetic polyneuropathy: Secondary | ICD-10-CM | POA: Diagnosis not present

## 2021-06-15 DIAGNOSIS — G311 Senile degeneration of brain, not elsewhere classified: Secondary | ICD-10-CM | POA: Diagnosis not present

## 2021-06-15 DIAGNOSIS — F0280 Dementia in other diseases classified elsewhere without behavioral disturbance: Secondary | ICD-10-CM | POA: Diagnosis not present

## 2021-06-15 DIAGNOSIS — I129 Hypertensive chronic kidney disease with stage 1 through stage 4 chronic kidney disease, or unspecified chronic kidney disease: Secondary | ICD-10-CM | POA: Diagnosis not present

## 2021-06-15 DIAGNOSIS — I739 Peripheral vascular disease, unspecified: Secondary | ICD-10-CM | POA: Diagnosis not present

## 2021-06-21 DIAGNOSIS — I129 Hypertensive chronic kidney disease with stage 1 through stage 4 chronic kidney disease, or unspecified chronic kidney disease: Secondary | ICD-10-CM | POA: Diagnosis not present

## 2021-06-21 DIAGNOSIS — E1142 Type 2 diabetes mellitus with diabetic polyneuropathy: Secondary | ICD-10-CM | POA: Diagnosis not present

## 2021-06-21 DIAGNOSIS — G311 Senile degeneration of brain, not elsewhere classified: Secondary | ICD-10-CM | POA: Diagnosis not present

## 2021-06-21 DIAGNOSIS — I739 Peripheral vascular disease, unspecified: Secondary | ICD-10-CM | POA: Diagnosis not present

## 2021-06-21 DIAGNOSIS — N1831 Chronic kidney disease, stage 3a: Secondary | ICD-10-CM | POA: Diagnosis not present

## 2021-06-21 DIAGNOSIS — F0280 Dementia in other diseases classified elsewhere without behavioral disturbance: Secondary | ICD-10-CM | POA: Diagnosis not present

## 2021-06-22 DIAGNOSIS — F0280 Dementia in other diseases classified elsewhere without behavioral disturbance: Secondary | ICD-10-CM | POA: Diagnosis not present

## 2021-06-22 DIAGNOSIS — I129 Hypertensive chronic kidney disease with stage 1 through stage 4 chronic kidney disease, or unspecified chronic kidney disease: Secondary | ICD-10-CM | POA: Diagnosis not present

## 2021-06-22 DIAGNOSIS — E1142 Type 2 diabetes mellitus with diabetic polyneuropathy: Secondary | ICD-10-CM | POA: Diagnosis not present

## 2021-06-22 DIAGNOSIS — G311 Senile degeneration of brain, not elsewhere classified: Secondary | ICD-10-CM | POA: Diagnosis not present

## 2021-06-22 DIAGNOSIS — I739 Peripheral vascular disease, unspecified: Secondary | ICD-10-CM | POA: Diagnosis not present

## 2021-06-22 DIAGNOSIS — N1831 Chronic kidney disease, stage 3a: Secondary | ICD-10-CM | POA: Diagnosis not present

## 2021-06-29 DIAGNOSIS — F0280 Dementia in other diseases classified elsewhere without behavioral disturbance: Secondary | ICD-10-CM | POA: Diagnosis not present

## 2021-06-29 DIAGNOSIS — G311 Senile degeneration of brain, not elsewhere classified: Secondary | ICD-10-CM | POA: Diagnosis not present

## 2021-06-29 DIAGNOSIS — E1142 Type 2 diabetes mellitus with diabetic polyneuropathy: Secondary | ICD-10-CM | POA: Diagnosis not present

## 2021-06-29 DIAGNOSIS — I129 Hypertensive chronic kidney disease with stage 1 through stage 4 chronic kidney disease, or unspecified chronic kidney disease: Secondary | ICD-10-CM | POA: Diagnosis not present

## 2021-06-29 DIAGNOSIS — I739 Peripheral vascular disease, unspecified: Secondary | ICD-10-CM | POA: Diagnosis not present

## 2021-06-29 DIAGNOSIS — N1831 Chronic kidney disease, stage 3a: Secondary | ICD-10-CM | POA: Diagnosis not present

## 2021-07-03 DIAGNOSIS — N1831 Chronic kidney disease, stage 3a: Secondary | ICD-10-CM | POA: Diagnosis not present

## 2021-07-03 DIAGNOSIS — F0280 Dementia in other diseases classified elsewhere without behavioral disturbance: Secondary | ICD-10-CM | POA: Diagnosis not present

## 2021-07-03 DIAGNOSIS — I129 Hypertensive chronic kidney disease with stage 1 through stage 4 chronic kidney disease, or unspecified chronic kidney disease: Secondary | ICD-10-CM | POA: Diagnosis not present

## 2021-07-03 DIAGNOSIS — E1142 Type 2 diabetes mellitus with diabetic polyneuropathy: Secondary | ICD-10-CM | POA: Diagnosis not present

## 2021-07-03 DIAGNOSIS — G311 Senile degeneration of brain, not elsewhere classified: Secondary | ICD-10-CM | POA: Diagnosis not present

## 2021-07-03 DIAGNOSIS — I739 Peripheral vascular disease, unspecified: Secondary | ICD-10-CM | POA: Diagnosis not present

## 2021-07-06 DIAGNOSIS — N1831 Chronic kidney disease, stage 3a: Secondary | ICD-10-CM | POA: Diagnosis not present

## 2021-07-06 DIAGNOSIS — E1142 Type 2 diabetes mellitus with diabetic polyneuropathy: Secondary | ICD-10-CM | POA: Diagnosis not present

## 2021-07-06 DIAGNOSIS — I739 Peripheral vascular disease, unspecified: Secondary | ICD-10-CM | POA: Diagnosis not present

## 2021-07-06 DIAGNOSIS — F0280 Dementia in other diseases classified elsewhere without behavioral disturbance: Secondary | ICD-10-CM | POA: Diagnosis not present

## 2021-07-06 DIAGNOSIS — I129 Hypertensive chronic kidney disease with stage 1 through stage 4 chronic kidney disease, or unspecified chronic kidney disease: Secondary | ICD-10-CM | POA: Diagnosis not present

## 2021-07-06 DIAGNOSIS — G311 Senile degeneration of brain, not elsewhere classified: Secondary | ICD-10-CM | POA: Diagnosis not present

## 2021-07-13 DIAGNOSIS — G311 Senile degeneration of brain, not elsewhere classified: Secondary | ICD-10-CM | POA: Diagnosis not present

## 2021-07-13 DIAGNOSIS — F0280 Dementia in other diseases classified elsewhere without behavioral disturbance: Secondary | ICD-10-CM | POA: Diagnosis not present

## 2021-07-13 DIAGNOSIS — I739 Peripheral vascular disease, unspecified: Secondary | ICD-10-CM | POA: Diagnosis not present

## 2021-07-13 DIAGNOSIS — I129 Hypertensive chronic kidney disease with stage 1 through stage 4 chronic kidney disease, or unspecified chronic kidney disease: Secondary | ICD-10-CM | POA: Diagnosis not present

## 2021-07-13 DIAGNOSIS — N1831 Chronic kidney disease, stage 3a: Secondary | ICD-10-CM | POA: Diagnosis not present

## 2021-07-13 DIAGNOSIS — E1142 Type 2 diabetes mellitus with diabetic polyneuropathy: Secondary | ICD-10-CM | POA: Diagnosis not present

## 2021-07-14 DIAGNOSIS — F0280 Dementia in other diseases classified elsewhere without behavioral disturbance: Secondary | ICD-10-CM | POA: Diagnosis not present

## 2021-07-14 DIAGNOSIS — E1142 Type 2 diabetes mellitus with diabetic polyneuropathy: Secondary | ICD-10-CM | POA: Diagnosis not present

## 2021-07-14 DIAGNOSIS — I739 Peripheral vascular disease, unspecified: Secondary | ICD-10-CM | POA: Diagnosis not present

## 2021-07-14 DIAGNOSIS — N1831 Chronic kidney disease, stage 3a: Secondary | ICD-10-CM | POA: Diagnosis not present

## 2021-07-14 DIAGNOSIS — G311 Senile degeneration of brain, not elsewhere classified: Secondary | ICD-10-CM | POA: Diagnosis not present

## 2021-07-14 DIAGNOSIS — I129 Hypertensive chronic kidney disease with stage 1 through stage 4 chronic kidney disease, or unspecified chronic kidney disease: Secondary | ICD-10-CM | POA: Diagnosis not present

## 2021-07-15 DIAGNOSIS — E785 Hyperlipidemia, unspecified: Secondary | ICD-10-CM | POA: Diagnosis not present

## 2021-07-15 DIAGNOSIS — I129 Hypertensive chronic kidney disease with stage 1 through stage 4 chronic kidney disease, or unspecified chronic kidney disease: Secondary | ICD-10-CM | POA: Diagnosis not present

## 2021-07-15 DIAGNOSIS — F0280 Dementia in other diseases classified elsewhere without behavioral disturbance: Secondary | ICD-10-CM | POA: Diagnosis not present

## 2021-07-15 DIAGNOSIS — N1831 Chronic kidney disease, stage 3a: Secondary | ICD-10-CM | POA: Diagnosis not present

## 2021-07-15 DIAGNOSIS — I739 Peripheral vascular disease, unspecified: Secondary | ICD-10-CM | POA: Diagnosis not present

## 2021-07-15 DIAGNOSIS — E039 Hypothyroidism, unspecified: Secondary | ICD-10-CM | POA: Diagnosis not present

## 2021-07-15 DIAGNOSIS — E1142 Type 2 diabetes mellitus with diabetic polyneuropathy: Secondary | ICD-10-CM | POA: Diagnosis not present

## 2021-07-15 DIAGNOSIS — L89152 Pressure ulcer of sacral region, stage 2: Secondary | ICD-10-CM | POA: Diagnosis not present

## 2021-07-15 DIAGNOSIS — F329 Major depressive disorder, single episode, unspecified: Secondary | ICD-10-CM | POA: Diagnosis not present

## 2021-07-15 DIAGNOSIS — G311 Senile degeneration of brain, not elsewhere classified: Secondary | ICD-10-CM | POA: Diagnosis not present

## 2021-07-15 DIAGNOSIS — R269 Unspecified abnormalities of gait and mobility: Secondary | ICD-10-CM | POA: Diagnosis not present

## 2021-07-15 DIAGNOSIS — Z8673 Personal history of transient ischemic attack (TIA), and cerebral infarction without residual deficits: Secondary | ICD-10-CM | POA: Diagnosis not present

## 2021-07-19 DIAGNOSIS — I129 Hypertensive chronic kidney disease with stage 1 through stage 4 chronic kidney disease, or unspecified chronic kidney disease: Secondary | ICD-10-CM | POA: Diagnosis not present

## 2021-07-19 DIAGNOSIS — F0280 Dementia in other diseases classified elsewhere without behavioral disturbance: Secondary | ICD-10-CM | POA: Diagnosis not present

## 2021-07-19 DIAGNOSIS — I739 Peripheral vascular disease, unspecified: Secondary | ICD-10-CM | POA: Diagnosis not present

## 2021-07-19 DIAGNOSIS — G311 Senile degeneration of brain, not elsewhere classified: Secondary | ICD-10-CM | POA: Diagnosis not present

## 2021-07-19 DIAGNOSIS — E1142 Type 2 diabetes mellitus with diabetic polyneuropathy: Secondary | ICD-10-CM | POA: Diagnosis not present

## 2021-07-19 DIAGNOSIS — N1831 Chronic kidney disease, stage 3a: Secondary | ICD-10-CM | POA: Diagnosis not present

## 2021-07-20 DIAGNOSIS — G311 Senile degeneration of brain, not elsewhere classified: Secondary | ICD-10-CM | POA: Diagnosis not present

## 2021-07-20 DIAGNOSIS — F0280 Dementia in other diseases classified elsewhere without behavioral disturbance: Secondary | ICD-10-CM | POA: Diagnosis not present

## 2021-07-20 DIAGNOSIS — N1831 Chronic kidney disease, stage 3a: Secondary | ICD-10-CM | POA: Diagnosis not present

## 2021-07-20 DIAGNOSIS — I739 Peripheral vascular disease, unspecified: Secondary | ICD-10-CM | POA: Diagnosis not present

## 2021-07-20 DIAGNOSIS — I129 Hypertensive chronic kidney disease with stage 1 through stage 4 chronic kidney disease, or unspecified chronic kidney disease: Secondary | ICD-10-CM | POA: Diagnosis not present

## 2021-07-20 DIAGNOSIS — E1142 Type 2 diabetes mellitus with diabetic polyneuropathy: Secondary | ICD-10-CM | POA: Diagnosis not present

## 2021-07-21 DIAGNOSIS — E1142 Type 2 diabetes mellitus with diabetic polyneuropathy: Secondary | ICD-10-CM | POA: Diagnosis not present

## 2021-07-21 DIAGNOSIS — F0280 Dementia in other diseases classified elsewhere without behavioral disturbance: Secondary | ICD-10-CM | POA: Diagnosis not present

## 2021-07-21 DIAGNOSIS — I739 Peripheral vascular disease, unspecified: Secondary | ICD-10-CM | POA: Diagnosis not present

## 2021-07-21 DIAGNOSIS — G311 Senile degeneration of brain, not elsewhere classified: Secondary | ICD-10-CM | POA: Diagnosis not present

## 2021-07-21 DIAGNOSIS — N1831 Chronic kidney disease, stage 3a: Secondary | ICD-10-CM | POA: Diagnosis not present

## 2021-07-21 DIAGNOSIS — I129 Hypertensive chronic kidney disease with stage 1 through stage 4 chronic kidney disease, or unspecified chronic kidney disease: Secondary | ICD-10-CM | POA: Diagnosis not present

## 2021-07-26 DIAGNOSIS — N1831 Chronic kidney disease, stage 3a: Secondary | ICD-10-CM | POA: Diagnosis not present

## 2021-07-26 DIAGNOSIS — E1142 Type 2 diabetes mellitus with diabetic polyneuropathy: Secondary | ICD-10-CM | POA: Diagnosis not present

## 2021-07-26 DIAGNOSIS — F0280 Dementia in other diseases classified elsewhere without behavioral disturbance: Secondary | ICD-10-CM | POA: Diagnosis not present

## 2021-07-26 DIAGNOSIS — I739 Peripheral vascular disease, unspecified: Secondary | ICD-10-CM | POA: Diagnosis not present

## 2021-07-26 DIAGNOSIS — G311 Senile degeneration of brain, not elsewhere classified: Secondary | ICD-10-CM | POA: Diagnosis not present

## 2021-07-26 DIAGNOSIS — I129 Hypertensive chronic kidney disease with stage 1 through stage 4 chronic kidney disease, or unspecified chronic kidney disease: Secondary | ICD-10-CM | POA: Diagnosis not present

## 2021-07-27 DIAGNOSIS — I739 Peripheral vascular disease, unspecified: Secondary | ICD-10-CM | POA: Diagnosis not present

## 2021-07-27 DIAGNOSIS — G311 Senile degeneration of brain, not elsewhere classified: Secondary | ICD-10-CM | POA: Diagnosis not present

## 2021-07-27 DIAGNOSIS — N1831 Chronic kidney disease, stage 3a: Secondary | ICD-10-CM | POA: Diagnosis not present

## 2021-07-27 DIAGNOSIS — I129 Hypertensive chronic kidney disease with stage 1 through stage 4 chronic kidney disease, or unspecified chronic kidney disease: Secondary | ICD-10-CM | POA: Diagnosis not present

## 2021-07-27 DIAGNOSIS — F0280 Dementia in other diseases classified elsewhere without behavioral disturbance: Secondary | ICD-10-CM | POA: Diagnosis not present

## 2021-07-27 DIAGNOSIS — E1142 Type 2 diabetes mellitus with diabetic polyneuropathy: Secondary | ICD-10-CM | POA: Diagnosis not present

## 2021-07-30 DIAGNOSIS — I129 Hypertensive chronic kidney disease with stage 1 through stage 4 chronic kidney disease, or unspecified chronic kidney disease: Secondary | ICD-10-CM | POA: Diagnosis not present

## 2021-07-30 DIAGNOSIS — N1831 Chronic kidney disease, stage 3a: Secondary | ICD-10-CM | POA: Diagnosis not present

## 2021-07-30 DIAGNOSIS — F0280 Dementia in other diseases classified elsewhere without behavioral disturbance: Secondary | ICD-10-CM | POA: Diagnosis not present

## 2021-07-30 DIAGNOSIS — G311 Senile degeneration of brain, not elsewhere classified: Secondary | ICD-10-CM | POA: Diagnosis not present

## 2021-07-30 DIAGNOSIS — I739 Peripheral vascular disease, unspecified: Secondary | ICD-10-CM | POA: Diagnosis not present

## 2021-07-30 DIAGNOSIS — E1142 Type 2 diabetes mellitus with diabetic polyneuropathy: Secondary | ICD-10-CM | POA: Diagnosis not present

## 2021-08-03 DIAGNOSIS — E1142 Type 2 diabetes mellitus with diabetic polyneuropathy: Secondary | ICD-10-CM | POA: Diagnosis not present

## 2021-08-03 DIAGNOSIS — G311 Senile degeneration of brain, not elsewhere classified: Secondary | ICD-10-CM | POA: Diagnosis not present

## 2021-08-03 DIAGNOSIS — I739 Peripheral vascular disease, unspecified: Secondary | ICD-10-CM | POA: Diagnosis not present

## 2021-08-03 DIAGNOSIS — F0280 Dementia in other diseases classified elsewhere without behavioral disturbance: Secondary | ICD-10-CM | POA: Diagnosis not present

## 2021-08-03 DIAGNOSIS — I129 Hypertensive chronic kidney disease with stage 1 through stage 4 chronic kidney disease, or unspecified chronic kidney disease: Secondary | ICD-10-CM | POA: Diagnosis not present

## 2021-08-03 DIAGNOSIS — N1831 Chronic kidney disease, stage 3a: Secondary | ICD-10-CM | POA: Diagnosis not present

## 2021-08-05 DIAGNOSIS — I129 Hypertensive chronic kidney disease with stage 1 through stage 4 chronic kidney disease, or unspecified chronic kidney disease: Secondary | ICD-10-CM | POA: Diagnosis not present

## 2021-08-05 DIAGNOSIS — G311 Senile degeneration of brain, not elsewhere classified: Secondary | ICD-10-CM | POA: Diagnosis not present

## 2021-08-05 DIAGNOSIS — F0280 Dementia in other diseases classified elsewhere without behavioral disturbance: Secondary | ICD-10-CM | POA: Diagnosis not present

## 2021-08-05 DIAGNOSIS — N1831 Chronic kidney disease, stage 3a: Secondary | ICD-10-CM | POA: Diagnosis not present

## 2021-08-05 DIAGNOSIS — I739 Peripheral vascular disease, unspecified: Secondary | ICD-10-CM | POA: Diagnosis not present

## 2021-08-05 DIAGNOSIS — E1142 Type 2 diabetes mellitus with diabetic polyneuropathy: Secondary | ICD-10-CM | POA: Diagnosis not present

## 2021-08-06 DIAGNOSIS — E1142 Type 2 diabetes mellitus with diabetic polyneuropathy: Secondary | ICD-10-CM | POA: Diagnosis not present

## 2021-08-06 DIAGNOSIS — F0280 Dementia in other diseases classified elsewhere without behavioral disturbance: Secondary | ICD-10-CM | POA: Diagnosis not present

## 2021-08-06 DIAGNOSIS — G311 Senile degeneration of brain, not elsewhere classified: Secondary | ICD-10-CM | POA: Diagnosis not present

## 2021-08-06 DIAGNOSIS — I129 Hypertensive chronic kidney disease with stage 1 through stage 4 chronic kidney disease, or unspecified chronic kidney disease: Secondary | ICD-10-CM | POA: Diagnosis not present

## 2021-08-06 DIAGNOSIS — I739 Peripheral vascular disease, unspecified: Secondary | ICD-10-CM | POA: Diagnosis not present

## 2021-08-06 DIAGNOSIS — N1831 Chronic kidney disease, stage 3a: Secondary | ICD-10-CM | POA: Diagnosis not present

## 2021-08-09 DIAGNOSIS — N1831 Chronic kidney disease, stage 3a: Secondary | ICD-10-CM | POA: Diagnosis not present

## 2021-08-09 DIAGNOSIS — F0280 Dementia in other diseases classified elsewhere without behavioral disturbance: Secondary | ICD-10-CM | POA: Diagnosis not present

## 2021-08-09 DIAGNOSIS — I129 Hypertensive chronic kidney disease with stage 1 through stage 4 chronic kidney disease, or unspecified chronic kidney disease: Secondary | ICD-10-CM | POA: Diagnosis not present

## 2021-08-09 DIAGNOSIS — E1142 Type 2 diabetes mellitus with diabetic polyneuropathy: Secondary | ICD-10-CM | POA: Diagnosis not present

## 2021-08-09 DIAGNOSIS — G311 Senile degeneration of brain, not elsewhere classified: Secondary | ICD-10-CM | POA: Diagnosis not present

## 2021-08-09 DIAGNOSIS — I739 Peripheral vascular disease, unspecified: Secondary | ICD-10-CM | POA: Diagnosis not present

## 2021-08-10 DIAGNOSIS — E1142 Type 2 diabetes mellitus with diabetic polyneuropathy: Secondary | ICD-10-CM | POA: Diagnosis not present

## 2021-08-10 DIAGNOSIS — I739 Peripheral vascular disease, unspecified: Secondary | ICD-10-CM | POA: Diagnosis not present

## 2021-08-10 DIAGNOSIS — I129 Hypertensive chronic kidney disease with stage 1 through stage 4 chronic kidney disease, or unspecified chronic kidney disease: Secondary | ICD-10-CM | POA: Diagnosis not present

## 2021-08-10 DIAGNOSIS — F0280 Dementia in other diseases classified elsewhere without behavioral disturbance: Secondary | ICD-10-CM | POA: Diagnosis not present

## 2021-08-10 DIAGNOSIS — G311 Senile degeneration of brain, not elsewhere classified: Secondary | ICD-10-CM | POA: Diagnosis not present

## 2021-08-10 DIAGNOSIS — N1831 Chronic kidney disease, stage 3a: Secondary | ICD-10-CM | POA: Diagnosis not present

## 2021-08-13 DIAGNOSIS — I129 Hypertensive chronic kidney disease with stage 1 through stage 4 chronic kidney disease, or unspecified chronic kidney disease: Secondary | ICD-10-CM | POA: Diagnosis not present

## 2021-08-13 DIAGNOSIS — F0280 Dementia in other diseases classified elsewhere without behavioral disturbance: Secondary | ICD-10-CM | POA: Diagnosis not present

## 2021-08-13 DIAGNOSIS — E1142 Type 2 diabetes mellitus with diabetic polyneuropathy: Secondary | ICD-10-CM | POA: Diagnosis not present

## 2021-08-13 DIAGNOSIS — I739 Peripheral vascular disease, unspecified: Secondary | ICD-10-CM | POA: Diagnosis not present

## 2021-08-13 DIAGNOSIS — N1831 Chronic kidney disease, stage 3a: Secondary | ICD-10-CM | POA: Diagnosis not present

## 2021-08-13 DIAGNOSIS — G311 Senile degeneration of brain, not elsewhere classified: Secondary | ICD-10-CM | POA: Diagnosis not present

## 2021-08-14 DIAGNOSIS — N1831 Chronic kidney disease, stage 3a: Secondary | ICD-10-CM | POA: Diagnosis not present

## 2021-08-14 DIAGNOSIS — I739 Peripheral vascular disease, unspecified: Secondary | ICD-10-CM | POA: Diagnosis not present

## 2021-08-14 DIAGNOSIS — F329 Major depressive disorder, single episode, unspecified: Secondary | ICD-10-CM | POA: Diagnosis not present

## 2021-08-14 DIAGNOSIS — E039 Hypothyroidism, unspecified: Secondary | ICD-10-CM | POA: Diagnosis not present

## 2021-08-14 DIAGNOSIS — L89152 Pressure ulcer of sacral region, stage 2: Secondary | ICD-10-CM | POA: Diagnosis not present

## 2021-08-14 DIAGNOSIS — E785 Hyperlipidemia, unspecified: Secondary | ICD-10-CM | POA: Diagnosis not present

## 2021-08-14 DIAGNOSIS — Z8673 Personal history of transient ischemic attack (TIA), and cerebral infarction without residual deficits: Secondary | ICD-10-CM | POA: Diagnosis not present

## 2021-08-14 DIAGNOSIS — R269 Unspecified abnormalities of gait and mobility: Secondary | ICD-10-CM | POA: Diagnosis not present

## 2021-08-14 DIAGNOSIS — E1142 Type 2 diabetes mellitus with diabetic polyneuropathy: Secondary | ICD-10-CM | POA: Diagnosis not present

## 2021-08-14 DIAGNOSIS — I129 Hypertensive chronic kidney disease with stage 1 through stage 4 chronic kidney disease, or unspecified chronic kidney disease: Secondary | ICD-10-CM | POA: Diagnosis not present

## 2021-08-14 DIAGNOSIS — G311 Senile degeneration of brain, not elsewhere classified: Secondary | ICD-10-CM | POA: Diagnosis not present

## 2021-08-14 DIAGNOSIS — F0280 Dementia in other diseases classified elsewhere without behavioral disturbance: Secondary | ICD-10-CM | POA: Diagnosis not present

## 2021-08-18 DIAGNOSIS — F0280 Dementia in other diseases classified elsewhere without behavioral disturbance: Secondary | ICD-10-CM | POA: Diagnosis not present

## 2021-08-18 DIAGNOSIS — I739 Peripheral vascular disease, unspecified: Secondary | ICD-10-CM | POA: Diagnosis not present

## 2021-08-18 DIAGNOSIS — I129 Hypertensive chronic kidney disease with stage 1 through stage 4 chronic kidney disease, or unspecified chronic kidney disease: Secondary | ICD-10-CM | POA: Diagnosis not present

## 2021-08-18 DIAGNOSIS — N1831 Chronic kidney disease, stage 3a: Secondary | ICD-10-CM | POA: Diagnosis not present

## 2021-08-18 DIAGNOSIS — G311 Senile degeneration of brain, not elsewhere classified: Secondary | ICD-10-CM | POA: Diagnosis not present

## 2021-08-18 DIAGNOSIS — E1142 Type 2 diabetes mellitus with diabetic polyneuropathy: Secondary | ICD-10-CM | POA: Diagnosis not present

## 2021-08-20 DIAGNOSIS — N1831 Chronic kidney disease, stage 3a: Secondary | ICD-10-CM | POA: Diagnosis not present

## 2021-08-20 DIAGNOSIS — F0280 Dementia in other diseases classified elsewhere without behavioral disturbance: Secondary | ICD-10-CM | POA: Diagnosis not present

## 2021-08-20 DIAGNOSIS — G311 Senile degeneration of brain, not elsewhere classified: Secondary | ICD-10-CM | POA: Diagnosis not present

## 2021-08-20 DIAGNOSIS — I129 Hypertensive chronic kidney disease with stage 1 through stage 4 chronic kidney disease, or unspecified chronic kidney disease: Secondary | ICD-10-CM | POA: Diagnosis not present

## 2021-08-20 DIAGNOSIS — E1142 Type 2 diabetes mellitus with diabetic polyneuropathy: Secondary | ICD-10-CM | POA: Diagnosis not present

## 2021-08-20 DIAGNOSIS — I739 Peripheral vascular disease, unspecified: Secondary | ICD-10-CM | POA: Diagnosis not present

## 2021-08-23 DIAGNOSIS — F0280 Dementia in other diseases classified elsewhere without behavioral disturbance: Secondary | ICD-10-CM | POA: Diagnosis not present

## 2021-08-23 DIAGNOSIS — N1831 Chronic kidney disease, stage 3a: Secondary | ICD-10-CM | POA: Diagnosis not present

## 2021-08-23 DIAGNOSIS — I739 Peripheral vascular disease, unspecified: Secondary | ICD-10-CM | POA: Diagnosis not present

## 2021-08-23 DIAGNOSIS — I129 Hypertensive chronic kidney disease with stage 1 through stage 4 chronic kidney disease, or unspecified chronic kidney disease: Secondary | ICD-10-CM | POA: Diagnosis not present

## 2021-08-23 DIAGNOSIS — E1142 Type 2 diabetes mellitus with diabetic polyneuropathy: Secondary | ICD-10-CM | POA: Diagnosis not present

## 2021-08-23 DIAGNOSIS — G311 Senile degeneration of brain, not elsewhere classified: Secondary | ICD-10-CM | POA: Diagnosis not present

## 2021-08-24 DIAGNOSIS — I739 Peripheral vascular disease, unspecified: Secondary | ICD-10-CM | POA: Diagnosis not present

## 2021-08-24 DIAGNOSIS — I129 Hypertensive chronic kidney disease with stage 1 through stage 4 chronic kidney disease, or unspecified chronic kidney disease: Secondary | ICD-10-CM | POA: Diagnosis not present

## 2021-08-24 DIAGNOSIS — N1831 Chronic kidney disease, stage 3a: Secondary | ICD-10-CM | POA: Diagnosis not present

## 2021-08-24 DIAGNOSIS — F0280 Dementia in other diseases classified elsewhere without behavioral disturbance: Secondary | ICD-10-CM | POA: Diagnosis not present

## 2021-08-24 DIAGNOSIS — E1142 Type 2 diabetes mellitus with diabetic polyneuropathy: Secondary | ICD-10-CM | POA: Diagnosis not present

## 2021-08-24 DIAGNOSIS — G311 Senile degeneration of brain, not elsewhere classified: Secondary | ICD-10-CM | POA: Diagnosis not present

## 2021-08-27 DIAGNOSIS — I739 Peripheral vascular disease, unspecified: Secondary | ICD-10-CM | POA: Diagnosis not present

## 2021-08-27 DIAGNOSIS — G311 Senile degeneration of brain, not elsewhere classified: Secondary | ICD-10-CM | POA: Diagnosis not present

## 2021-08-27 DIAGNOSIS — F0280 Dementia in other diseases classified elsewhere without behavioral disturbance: Secondary | ICD-10-CM | POA: Diagnosis not present

## 2021-08-27 DIAGNOSIS — E1142 Type 2 diabetes mellitus with diabetic polyneuropathy: Secondary | ICD-10-CM | POA: Diagnosis not present

## 2021-08-27 DIAGNOSIS — N1831 Chronic kidney disease, stage 3a: Secondary | ICD-10-CM | POA: Diagnosis not present

## 2021-08-27 DIAGNOSIS — I129 Hypertensive chronic kidney disease with stage 1 through stage 4 chronic kidney disease, or unspecified chronic kidney disease: Secondary | ICD-10-CM | POA: Diagnosis not present

## 2021-08-31 DIAGNOSIS — F0280 Dementia in other diseases classified elsewhere without behavioral disturbance: Secondary | ICD-10-CM | POA: Diagnosis not present

## 2021-08-31 DIAGNOSIS — G311 Senile degeneration of brain, not elsewhere classified: Secondary | ICD-10-CM | POA: Diagnosis not present

## 2021-08-31 DIAGNOSIS — E1142 Type 2 diabetes mellitus with diabetic polyneuropathy: Secondary | ICD-10-CM | POA: Diagnosis not present

## 2021-08-31 DIAGNOSIS — I129 Hypertensive chronic kidney disease with stage 1 through stage 4 chronic kidney disease, or unspecified chronic kidney disease: Secondary | ICD-10-CM | POA: Diagnosis not present

## 2021-08-31 DIAGNOSIS — I739 Peripheral vascular disease, unspecified: Secondary | ICD-10-CM | POA: Diagnosis not present

## 2021-08-31 DIAGNOSIS — N1831 Chronic kidney disease, stage 3a: Secondary | ICD-10-CM | POA: Diagnosis not present

## 2021-09-02 DIAGNOSIS — N1831 Chronic kidney disease, stage 3a: Secondary | ICD-10-CM | POA: Diagnosis not present

## 2021-09-02 DIAGNOSIS — I739 Peripheral vascular disease, unspecified: Secondary | ICD-10-CM | POA: Diagnosis not present

## 2021-09-02 DIAGNOSIS — I129 Hypertensive chronic kidney disease with stage 1 through stage 4 chronic kidney disease, or unspecified chronic kidney disease: Secondary | ICD-10-CM | POA: Diagnosis not present

## 2021-09-02 DIAGNOSIS — F0280 Dementia in other diseases classified elsewhere without behavioral disturbance: Secondary | ICD-10-CM | POA: Diagnosis not present

## 2021-09-02 DIAGNOSIS — G311 Senile degeneration of brain, not elsewhere classified: Secondary | ICD-10-CM | POA: Diagnosis not present

## 2021-09-02 DIAGNOSIS — E1142 Type 2 diabetes mellitus with diabetic polyneuropathy: Secondary | ICD-10-CM | POA: Diagnosis not present

## 2021-09-03 DIAGNOSIS — E1142 Type 2 diabetes mellitus with diabetic polyneuropathy: Secondary | ICD-10-CM | POA: Diagnosis not present

## 2021-09-03 DIAGNOSIS — F0280 Dementia in other diseases classified elsewhere without behavioral disturbance: Secondary | ICD-10-CM | POA: Diagnosis not present

## 2021-09-03 DIAGNOSIS — N1831 Chronic kidney disease, stage 3a: Secondary | ICD-10-CM | POA: Diagnosis not present

## 2021-09-03 DIAGNOSIS — I739 Peripheral vascular disease, unspecified: Secondary | ICD-10-CM | POA: Diagnosis not present

## 2021-09-03 DIAGNOSIS — G311 Senile degeneration of brain, not elsewhere classified: Secondary | ICD-10-CM | POA: Diagnosis not present

## 2021-09-03 DIAGNOSIS — I129 Hypertensive chronic kidney disease with stage 1 through stage 4 chronic kidney disease, or unspecified chronic kidney disease: Secondary | ICD-10-CM | POA: Diagnosis not present

## 2021-09-07 DIAGNOSIS — F0280 Dementia in other diseases classified elsewhere without behavioral disturbance: Secondary | ICD-10-CM | POA: Diagnosis not present

## 2021-09-07 DIAGNOSIS — E1142 Type 2 diabetes mellitus with diabetic polyneuropathy: Secondary | ICD-10-CM | POA: Diagnosis not present

## 2021-09-07 DIAGNOSIS — I739 Peripheral vascular disease, unspecified: Secondary | ICD-10-CM | POA: Diagnosis not present

## 2021-09-07 DIAGNOSIS — I129 Hypertensive chronic kidney disease with stage 1 through stage 4 chronic kidney disease, or unspecified chronic kidney disease: Secondary | ICD-10-CM | POA: Diagnosis not present

## 2021-09-07 DIAGNOSIS — N1831 Chronic kidney disease, stage 3a: Secondary | ICD-10-CM | POA: Diagnosis not present

## 2021-09-07 DIAGNOSIS — G311 Senile degeneration of brain, not elsewhere classified: Secondary | ICD-10-CM | POA: Diagnosis not present

## 2021-09-14 DIAGNOSIS — F329 Major depressive disorder, single episode, unspecified: Secondary | ICD-10-CM | POA: Diagnosis not present

## 2021-09-14 DIAGNOSIS — N1831 Chronic kidney disease, stage 3a: Secondary | ICD-10-CM | POA: Diagnosis not present

## 2021-09-14 DIAGNOSIS — I739 Peripheral vascular disease, unspecified: Secondary | ICD-10-CM | POA: Diagnosis not present

## 2021-09-14 DIAGNOSIS — E039 Hypothyroidism, unspecified: Secondary | ICD-10-CM | POA: Diagnosis not present

## 2021-09-14 DIAGNOSIS — F0280 Dementia in other diseases classified elsewhere without behavioral disturbance: Secondary | ICD-10-CM | POA: Diagnosis not present

## 2021-09-14 DIAGNOSIS — I129 Hypertensive chronic kidney disease with stage 1 through stage 4 chronic kidney disease, or unspecified chronic kidney disease: Secondary | ICD-10-CM | POA: Diagnosis not present

## 2021-09-14 DIAGNOSIS — G311 Senile degeneration of brain, not elsewhere classified: Secondary | ICD-10-CM | POA: Diagnosis not present

## 2021-09-14 DIAGNOSIS — E785 Hyperlipidemia, unspecified: Secondary | ICD-10-CM | POA: Diagnosis not present

## 2021-09-14 DIAGNOSIS — E1142 Type 2 diabetes mellitus with diabetic polyneuropathy: Secondary | ICD-10-CM | POA: Diagnosis not present

## 2021-09-14 DIAGNOSIS — R269 Unspecified abnormalities of gait and mobility: Secondary | ICD-10-CM | POA: Diagnosis not present

## 2021-09-14 DIAGNOSIS — Z8673 Personal history of transient ischemic attack (TIA), and cerebral infarction without residual deficits: Secondary | ICD-10-CM | POA: Diagnosis not present

## 2021-09-14 DIAGNOSIS — L89152 Pressure ulcer of sacral region, stage 2: Secondary | ICD-10-CM | POA: Diagnosis not present

## 2021-09-17 DIAGNOSIS — G311 Senile degeneration of brain, not elsewhere classified: Secondary | ICD-10-CM | POA: Diagnosis not present

## 2021-09-17 DIAGNOSIS — I739 Peripheral vascular disease, unspecified: Secondary | ICD-10-CM | POA: Diagnosis not present

## 2021-09-17 DIAGNOSIS — I129 Hypertensive chronic kidney disease with stage 1 through stage 4 chronic kidney disease, or unspecified chronic kidney disease: Secondary | ICD-10-CM | POA: Diagnosis not present

## 2021-09-17 DIAGNOSIS — N1831 Chronic kidney disease, stage 3a: Secondary | ICD-10-CM | POA: Diagnosis not present

## 2021-09-17 DIAGNOSIS — E1142 Type 2 diabetes mellitus with diabetic polyneuropathy: Secondary | ICD-10-CM | POA: Diagnosis not present

## 2021-09-17 DIAGNOSIS — F0280 Dementia in other diseases classified elsewhere without behavioral disturbance: Secondary | ICD-10-CM | POA: Diagnosis not present

## 2021-09-21 DIAGNOSIS — E1142 Type 2 diabetes mellitus with diabetic polyneuropathy: Secondary | ICD-10-CM | POA: Diagnosis not present

## 2021-09-21 DIAGNOSIS — F0280 Dementia in other diseases classified elsewhere without behavioral disturbance: Secondary | ICD-10-CM | POA: Diagnosis not present

## 2021-09-21 DIAGNOSIS — I129 Hypertensive chronic kidney disease with stage 1 through stage 4 chronic kidney disease, or unspecified chronic kidney disease: Secondary | ICD-10-CM | POA: Diagnosis not present

## 2021-09-21 DIAGNOSIS — I739 Peripheral vascular disease, unspecified: Secondary | ICD-10-CM | POA: Diagnosis not present

## 2021-09-21 DIAGNOSIS — N1831 Chronic kidney disease, stage 3a: Secondary | ICD-10-CM | POA: Diagnosis not present

## 2021-09-21 DIAGNOSIS — G311 Senile degeneration of brain, not elsewhere classified: Secondary | ICD-10-CM | POA: Diagnosis not present

## 2021-09-22 DIAGNOSIS — N1831 Chronic kidney disease, stage 3a: Secondary | ICD-10-CM | POA: Diagnosis not present

## 2021-09-22 DIAGNOSIS — F0280 Dementia in other diseases classified elsewhere without behavioral disturbance: Secondary | ICD-10-CM | POA: Diagnosis not present

## 2021-09-22 DIAGNOSIS — I129 Hypertensive chronic kidney disease with stage 1 through stage 4 chronic kidney disease, or unspecified chronic kidney disease: Secondary | ICD-10-CM | POA: Diagnosis not present

## 2021-09-22 DIAGNOSIS — E1142 Type 2 diabetes mellitus with diabetic polyneuropathy: Secondary | ICD-10-CM | POA: Diagnosis not present

## 2021-09-22 DIAGNOSIS — G311 Senile degeneration of brain, not elsewhere classified: Secondary | ICD-10-CM | POA: Diagnosis not present

## 2021-09-22 DIAGNOSIS — I739 Peripheral vascular disease, unspecified: Secondary | ICD-10-CM | POA: Diagnosis not present

## 2021-09-24 DIAGNOSIS — I129 Hypertensive chronic kidney disease with stage 1 through stage 4 chronic kidney disease, or unspecified chronic kidney disease: Secondary | ICD-10-CM | POA: Diagnosis not present

## 2021-09-24 DIAGNOSIS — I739 Peripheral vascular disease, unspecified: Secondary | ICD-10-CM | POA: Diagnosis not present

## 2021-09-24 DIAGNOSIS — N1831 Chronic kidney disease, stage 3a: Secondary | ICD-10-CM | POA: Diagnosis not present

## 2021-09-24 DIAGNOSIS — G311 Senile degeneration of brain, not elsewhere classified: Secondary | ICD-10-CM | POA: Diagnosis not present

## 2021-09-24 DIAGNOSIS — F0280 Dementia in other diseases classified elsewhere without behavioral disturbance: Secondary | ICD-10-CM | POA: Diagnosis not present

## 2021-09-24 DIAGNOSIS — E1142 Type 2 diabetes mellitus with diabetic polyneuropathy: Secondary | ICD-10-CM | POA: Diagnosis not present

## 2021-09-28 DIAGNOSIS — N1831 Chronic kidney disease, stage 3a: Secondary | ICD-10-CM | POA: Diagnosis not present

## 2021-09-28 DIAGNOSIS — I129 Hypertensive chronic kidney disease with stage 1 through stage 4 chronic kidney disease, or unspecified chronic kidney disease: Secondary | ICD-10-CM | POA: Diagnosis not present

## 2021-09-28 DIAGNOSIS — I739 Peripheral vascular disease, unspecified: Secondary | ICD-10-CM | POA: Diagnosis not present

## 2021-09-28 DIAGNOSIS — E1142 Type 2 diabetes mellitus with diabetic polyneuropathy: Secondary | ICD-10-CM | POA: Diagnosis not present

## 2021-09-28 DIAGNOSIS — F0280 Dementia in other diseases classified elsewhere without behavioral disturbance: Secondary | ICD-10-CM | POA: Diagnosis not present

## 2021-09-28 DIAGNOSIS — G311 Senile degeneration of brain, not elsewhere classified: Secondary | ICD-10-CM | POA: Diagnosis not present

## 2021-10-01 DIAGNOSIS — I129 Hypertensive chronic kidney disease with stage 1 through stage 4 chronic kidney disease, or unspecified chronic kidney disease: Secondary | ICD-10-CM | POA: Diagnosis not present

## 2021-10-01 DIAGNOSIS — N1831 Chronic kidney disease, stage 3a: Secondary | ICD-10-CM | POA: Diagnosis not present

## 2021-10-01 DIAGNOSIS — E1142 Type 2 diabetes mellitus with diabetic polyneuropathy: Secondary | ICD-10-CM | POA: Diagnosis not present

## 2021-10-01 DIAGNOSIS — G311 Senile degeneration of brain, not elsewhere classified: Secondary | ICD-10-CM | POA: Diagnosis not present

## 2021-10-01 DIAGNOSIS — I739 Peripheral vascular disease, unspecified: Secondary | ICD-10-CM | POA: Diagnosis not present

## 2021-10-01 DIAGNOSIS — F0280 Dementia in other diseases classified elsewhere without behavioral disturbance: Secondary | ICD-10-CM | POA: Diagnosis not present

## 2021-10-05 DIAGNOSIS — I739 Peripheral vascular disease, unspecified: Secondary | ICD-10-CM | POA: Diagnosis not present

## 2021-10-05 DIAGNOSIS — F0280 Dementia in other diseases classified elsewhere without behavioral disturbance: Secondary | ICD-10-CM | POA: Diagnosis not present

## 2021-10-05 DIAGNOSIS — E1142 Type 2 diabetes mellitus with diabetic polyneuropathy: Secondary | ICD-10-CM | POA: Diagnosis not present

## 2021-10-05 DIAGNOSIS — G311 Senile degeneration of brain, not elsewhere classified: Secondary | ICD-10-CM | POA: Diagnosis not present

## 2021-10-05 DIAGNOSIS — I129 Hypertensive chronic kidney disease with stage 1 through stage 4 chronic kidney disease, or unspecified chronic kidney disease: Secondary | ICD-10-CM | POA: Diagnosis not present

## 2021-10-05 DIAGNOSIS — N1831 Chronic kidney disease, stage 3a: Secondary | ICD-10-CM | POA: Diagnosis not present

## 2021-10-10 DIAGNOSIS — E1142 Type 2 diabetes mellitus with diabetic polyneuropathy: Secondary | ICD-10-CM | POA: Diagnosis not present

## 2021-10-10 DIAGNOSIS — I129 Hypertensive chronic kidney disease with stage 1 through stage 4 chronic kidney disease, or unspecified chronic kidney disease: Secondary | ICD-10-CM | POA: Diagnosis not present

## 2021-10-10 DIAGNOSIS — G311 Senile degeneration of brain, not elsewhere classified: Secondary | ICD-10-CM | POA: Diagnosis not present

## 2021-10-10 DIAGNOSIS — N1831 Chronic kidney disease, stage 3a: Secondary | ICD-10-CM | POA: Diagnosis not present

## 2021-10-10 DIAGNOSIS — F0280 Dementia in other diseases classified elsewhere without behavioral disturbance: Secondary | ICD-10-CM | POA: Diagnosis not present

## 2021-10-10 DIAGNOSIS — I739 Peripheral vascular disease, unspecified: Secondary | ICD-10-CM | POA: Diagnosis not present

## 2021-10-12 DIAGNOSIS — E1142 Type 2 diabetes mellitus with diabetic polyneuropathy: Secondary | ICD-10-CM | POA: Diagnosis not present

## 2021-10-12 DIAGNOSIS — N1831 Chronic kidney disease, stage 3a: Secondary | ICD-10-CM | POA: Diagnosis not present

## 2021-10-12 DIAGNOSIS — I739 Peripheral vascular disease, unspecified: Secondary | ICD-10-CM | POA: Diagnosis not present

## 2021-10-12 DIAGNOSIS — G311 Senile degeneration of brain, not elsewhere classified: Secondary | ICD-10-CM | POA: Diagnosis not present

## 2021-10-12 DIAGNOSIS — F0280 Dementia in other diseases classified elsewhere without behavioral disturbance: Secondary | ICD-10-CM | POA: Diagnosis not present

## 2021-10-12 DIAGNOSIS — I129 Hypertensive chronic kidney disease with stage 1 through stage 4 chronic kidney disease, or unspecified chronic kidney disease: Secondary | ICD-10-CM | POA: Diagnosis not present

## 2021-10-15 DIAGNOSIS — N1831 Chronic kidney disease, stage 3a: Secondary | ICD-10-CM | POA: Diagnosis not present

## 2021-10-15 DIAGNOSIS — F329 Major depressive disorder, single episode, unspecified: Secondary | ICD-10-CM | POA: Diagnosis not present

## 2021-10-15 DIAGNOSIS — R269 Unspecified abnormalities of gait and mobility: Secondary | ICD-10-CM | POA: Diagnosis not present

## 2021-10-15 DIAGNOSIS — E785 Hyperlipidemia, unspecified: Secondary | ICD-10-CM | POA: Diagnosis not present

## 2021-10-15 DIAGNOSIS — G311 Senile degeneration of brain, not elsewhere classified: Secondary | ICD-10-CM | POA: Diagnosis not present

## 2021-10-15 DIAGNOSIS — F0280 Dementia in other diseases classified elsewhere without behavioral disturbance: Secondary | ICD-10-CM | POA: Diagnosis not present

## 2021-10-15 DIAGNOSIS — I129 Hypertensive chronic kidney disease with stage 1 through stage 4 chronic kidney disease, or unspecified chronic kidney disease: Secondary | ICD-10-CM | POA: Diagnosis not present

## 2021-10-15 DIAGNOSIS — E039 Hypothyroidism, unspecified: Secondary | ICD-10-CM | POA: Diagnosis not present

## 2021-10-15 DIAGNOSIS — E1142 Type 2 diabetes mellitus with diabetic polyneuropathy: Secondary | ICD-10-CM | POA: Diagnosis not present

## 2021-10-15 DIAGNOSIS — L89152 Pressure ulcer of sacral region, stage 2: Secondary | ICD-10-CM | POA: Diagnosis not present

## 2021-10-15 DIAGNOSIS — Z8673 Personal history of transient ischemic attack (TIA), and cerebral infarction without residual deficits: Secondary | ICD-10-CM | POA: Diagnosis not present

## 2021-10-15 DIAGNOSIS — I739 Peripheral vascular disease, unspecified: Secondary | ICD-10-CM | POA: Diagnosis not present

## 2021-10-19 DIAGNOSIS — F0280 Dementia in other diseases classified elsewhere without behavioral disturbance: Secondary | ICD-10-CM | POA: Diagnosis not present

## 2021-10-19 DIAGNOSIS — I129 Hypertensive chronic kidney disease with stage 1 through stage 4 chronic kidney disease, or unspecified chronic kidney disease: Secondary | ICD-10-CM | POA: Diagnosis not present

## 2021-10-19 DIAGNOSIS — I739 Peripheral vascular disease, unspecified: Secondary | ICD-10-CM | POA: Diagnosis not present

## 2021-10-19 DIAGNOSIS — E1142 Type 2 diabetes mellitus with diabetic polyneuropathy: Secondary | ICD-10-CM | POA: Diagnosis not present

## 2021-10-19 DIAGNOSIS — N1831 Chronic kidney disease, stage 3a: Secondary | ICD-10-CM | POA: Diagnosis not present

## 2021-10-19 DIAGNOSIS — G311 Senile degeneration of brain, not elsewhere classified: Secondary | ICD-10-CM | POA: Diagnosis not present

## 2021-10-22 DIAGNOSIS — I129 Hypertensive chronic kidney disease with stage 1 through stage 4 chronic kidney disease, or unspecified chronic kidney disease: Secondary | ICD-10-CM | POA: Diagnosis not present

## 2021-10-22 DIAGNOSIS — I739 Peripheral vascular disease, unspecified: Secondary | ICD-10-CM | POA: Diagnosis not present

## 2021-10-22 DIAGNOSIS — E1142 Type 2 diabetes mellitus with diabetic polyneuropathy: Secondary | ICD-10-CM | POA: Diagnosis not present

## 2021-10-22 DIAGNOSIS — F0280 Dementia in other diseases classified elsewhere without behavioral disturbance: Secondary | ICD-10-CM | POA: Diagnosis not present

## 2021-10-22 DIAGNOSIS — G311 Senile degeneration of brain, not elsewhere classified: Secondary | ICD-10-CM | POA: Diagnosis not present

## 2021-10-22 DIAGNOSIS — N1831 Chronic kidney disease, stage 3a: Secondary | ICD-10-CM | POA: Diagnosis not present

## 2021-10-26 DIAGNOSIS — I129 Hypertensive chronic kidney disease with stage 1 through stage 4 chronic kidney disease, or unspecified chronic kidney disease: Secondary | ICD-10-CM | POA: Diagnosis not present

## 2021-10-26 DIAGNOSIS — N1831 Chronic kidney disease, stage 3a: Secondary | ICD-10-CM | POA: Diagnosis not present

## 2021-10-26 DIAGNOSIS — G311 Senile degeneration of brain, not elsewhere classified: Secondary | ICD-10-CM | POA: Diagnosis not present

## 2021-10-26 DIAGNOSIS — I739 Peripheral vascular disease, unspecified: Secondary | ICD-10-CM | POA: Diagnosis not present

## 2021-10-26 DIAGNOSIS — F0280 Dementia in other diseases classified elsewhere without behavioral disturbance: Secondary | ICD-10-CM | POA: Diagnosis not present

## 2021-10-26 DIAGNOSIS — E1142 Type 2 diabetes mellitus with diabetic polyneuropathy: Secondary | ICD-10-CM | POA: Diagnosis not present

## 2021-10-29 DIAGNOSIS — N1831 Chronic kidney disease, stage 3a: Secondary | ICD-10-CM | POA: Diagnosis not present

## 2021-10-29 DIAGNOSIS — E1142 Type 2 diabetes mellitus with diabetic polyneuropathy: Secondary | ICD-10-CM | POA: Diagnosis not present

## 2021-10-29 DIAGNOSIS — I129 Hypertensive chronic kidney disease with stage 1 through stage 4 chronic kidney disease, or unspecified chronic kidney disease: Secondary | ICD-10-CM | POA: Diagnosis not present

## 2021-10-29 DIAGNOSIS — G311 Senile degeneration of brain, not elsewhere classified: Secondary | ICD-10-CM | POA: Diagnosis not present

## 2021-10-29 DIAGNOSIS — F0280 Dementia in other diseases classified elsewhere without behavioral disturbance: Secondary | ICD-10-CM | POA: Diagnosis not present

## 2021-10-29 DIAGNOSIS — I739 Peripheral vascular disease, unspecified: Secondary | ICD-10-CM | POA: Diagnosis not present

## 2021-11-01 DIAGNOSIS — F0280 Dementia in other diseases classified elsewhere without behavioral disturbance: Secondary | ICD-10-CM | POA: Diagnosis not present

## 2021-11-01 DIAGNOSIS — G311 Senile degeneration of brain, not elsewhere classified: Secondary | ICD-10-CM | POA: Diagnosis not present

## 2021-11-01 DIAGNOSIS — I129 Hypertensive chronic kidney disease with stage 1 through stage 4 chronic kidney disease, or unspecified chronic kidney disease: Secondary | ICD-10-CM | POA: Diagnosis not present

## 2021-11-01 DIAGNOSIS — N1831 Chronic kidney disease, stage 3a: Secondary | ICD-10-CM | POA: Diagnosis not present

## 2021-11-01 DIAGNOSIS — E1142 Type 2 diabetes mellitus with diabetic polyneuropathy: Secondary | ICD-10-CM | POA: Diagnosis not present

## 2021-11-01 DIAGNOSIS — I739 Peripheral vascular disease, unspecified: Secondary | ICD-10-CM | POA: Diagnosis not present

## 2021-11-02 DIAGNOSIS — I129 Hypertensive chronic kidney disease with stage 1 through stage 4 chronic kidney disease, or unspecified chronic kidney disease: Secondary | ICD-10-CM | POA: Diagnosis not present

## 2021-11-02 DIAGNOSIS — I739 Peripheral vascular disease, unspecified: Secondary | ICD-10-CM | POA: Diagnosis not present

## 2021-11-02 DIAGNOSIS — N1831 Chronic kidney disease, stage 3a: Secondary | ICD-10-CM | POA: Diagnosis not present

## 2021-11-02 DIAGNOSIS — G311 Senile degeneration of brain, not elsewhere classified: Secondary | ICD-10-CM | POA: Diagnosis not present

## 2021-11-02 DIAGNOSIS — E1142 Type 2 diabetes mellitus with diabetic polyneuropathy: Secondary | ICD-10-CM | POA: Diagnosis not present

## 2021-11-02 DIAGNOSIS — F0280 Dementia in other diseases classified elsewhere without behavioral disturbance: Secondary | ICD-10-CM | POA: Diagnosis not present

## 2021-11-05 DIAGNOSIS — E1142 Type 2 diabetes mellitus with diabetic polyneuropathy: Secondary | ICD-10-CM | POA: Diagnosis not present

## 2021-11-05 DIAGNOSIS — I739 Peripheral vascular disease, unspecified: Secondary | ICD-10-CM | POA: Diagnosis not present

## 2021-11-05 DIAGNOSIS — G311 Senile degeneration of brain, not elsewhere classified: Secondary | ICD-10-CM | POA: Diagnosis not present

## 2021-11-05 DIAGNOSIS — N1831 Chronic kidney disease, stage 3a: Secondary | ICD-10-CM | POA: Diagnosis not present

## 2021-11-05 DIAGNOSIS — F0280 Dementia in other diseases classified elsewhere without behavioral disturbance: Secondary | ICD-10-CM | POA: Diagnosis not present

## 2021-11-05 DIAGNOSIS — I129 Hypertensive chronic kidney disease with stage 1 through stage 4 chronic kidney disease, or unspecified chronic kidney disease: Secondary | ICD-10-CM | POA: Diagnosis not present

## 2021-11-09 DIAGNOSIS — E1142 Type 2 diabetes mellitus with diabetic polyneuropathy: Secondary | ICD-10-CM | POA: Diagnosis not present

## 2021-11-09 DIAGNOSIS — G311 Senile degeneration of brain, not elsewhere classified: Secondary | ICD-10-CM | POA: Diagnosis not present

## 2021-11-09 DIAGNOSIS — I129 Hypertensive chronic kidney disease with stage 1 through stage 4 chronic kidney disease, or unspecified chronic kidney disease: Secondary | ICD-10-CM | POA: Diagnosis not present

## 2021-11-09 DIAGNOSIS — I739 Peripheral vascular disease, unspecified: Secondary | ICD-10-CM | POA: Diagnosis not present

## 2021-11-09 DIAGNOSIS — F0280 Dementia in other diseases classified elsewhere without behavioral disturbance: Secondary | ICD-10-CM | POA: Diagnosis not present

## 2021-11-09 DIAGNOSIS — N1831 Chronic kidney disease, stage 3a: Secondary | ICD-10-CM | POA: Diagnosis not present

## 2021-11-12 DIAGNOSIS — E1142 Type 2 diabetes mellitus with diabetic polyneuropathy: Secondary | ICD-10-CM | POA: Diagnosis not present

## 2021-11-12 DIAGNOSIS — I739 Peripheral vascular disease, unspecified: Secondary | ICD-10-CM | POA: Diagnosis not present

## 2021-11-12 DIAGNOSIS — I129 Hypertensive chronic kidney disease with stage 1 through stage 4 chronic kidney disease, or unspecified chronic kidney disease: Secondary | ICD-10-CM | POA: Diagnosis not present

## 2021-11-12 DIAGNOSIS — N1831 Chronic kidney disease, stage 3a: Secondary | ICD-10-CM | POA: Diagnosis not present

## 2021-11-12 DIAGNOSIS — F0280 Dementia in other diseases classified elsewhere without behavioral disturbance: Secondary | ICD-10-CM | POA: Diagnosis not present

## 2021-11-12 DIAGNOSIS — G311 Senile degeneration of brain, not elsewhere classified: Secondary | ICD-10-CM | POA: Diagnosis not present

## 2021-11-14 DIAGNOSIS — R269 Unspecified abnormalities of gait and mobility: Secondary | ICD-10-CM | POA: Diagnosis not present

## 2021-11-14 DIAGNOSIS — F0280 Dementia in other diseases classified elsewhere without behavioral disturbance: Secondary | ICD-10-CM | POA: Diagnosis not present

## 2021-11-14 DIAGNOSIS — I129 Hypertensive chronic kidney disease with stage 1 through stage 4 chronic kidney disease, or unspecified chronic kidney disease: Secondary | ICD-10-CM | POA: Diagnosis not present

## 2021-11-14 DIAGNOSIS — E1142 Type 2 diabetes mellitus with diabetic polyneuropathy: Secondary | ICD-10-CM | POA: Diagnosis not present

## 2021-11-14 DIAGNOSIS — Z8673 Personal history of transient ischemic attack (TIA), and cerebral infarction without residual deficits: Secondary | ICD-10-CM | POA: Diagnosis not present

## 2021-11-14 DIAGNOSIS — N1831 Chronic kidney disease, stage 3a: Secondary | ICD-10-CM | POA: Diagnosis not present

## 2021-11-14 DIAGNOSIS — G311 Senile degeneration of brain, not elsewhere classified: Secondary | ICD-10-CM | POA: Diagnosis not present

## 2021-11-14 DIAGNOSIS — F329 Major depressive disorder, single episode, unspecified: Secondary | ICD-10-CM | POA: Diagnosis not present

## 2021-11-14 DIAGNOSIS — E039 Hypothyroidism, unspecified: Secondary | ICD-10-CM | POA: Diagnosis not present

## 2021-11-14 DIAGNOSIS — I739 Peripheral vascular disease, unspecified: Secondary | ICD-10-CM | POA: Diagnosis not present

## 2021-11-14 DIAGNOSIS — E785 Hyperlipidemia, unspecified: Secondary | ICD-10-CM | POA: Diagnosis not present

## 2021-11-14 DIAGNOSIS — L89152 Pressure ulcer of sacral region, stage 2: Secondary | ICD-10-CM | POA: Diagnosis not present

## 2021-11-16 DIAGNOSIS — E1142 Type 2 diabetes mellitus with diabetic polyneuropathy: Secondary | ICD-10-CM | POA: Diagnosis not present

## 2021-11-16 DIAGNOSIS — I129 Hypertensive chronic kidney disease with stage 1 through stage 4 chronic kidney disease, or unspecified chronic kidney disease: Secondary | ICD-10-CM | POA: Diagnosis not present

## 2021-11-16 DIAGNOSIS — F0280 Dementia in other diseases classified elsewhere without behavioral disturbance: Secondary | ICD-10-CM | POA: Diagnosis not present

## 2021-11-16 DIAGNOSIS — N1831 Chronic kidney disease, stage 3a: Secondary | ICD-10-CM | POA: Diagnosis not present

## 2021-11-16 DIAGNOSIS — G311 Senile degeneration of brain, not elsewhere classified: Secondary | ICD-10-CM | POA: Diagnosis not present

## 2021-11-16 DIAGNOSIS — I739 Peripheral vascular disease, unspecified: Secondary | ICD-10-CM | POA: Diagnosis not present

## 2021-11-17 DIAGNOSIS — G311 Senile degeneration of brain, not elsewhere classified: Secondary | ICD-10-CM | POA: Diagnosis not present

## 2021-11-17 DIAGNOSIS — E1142 Type 2 diabetes mellitus with diabetic polyneuropathy: Secondary | ICD-10-CM | POA: Diagnosis not present

## 2021-11-17 DIAGNOSIS — F0280 Dementia in other diseases classified elsewhere without behavioral disturbance: Secondary | ICD-10-CM | POA: Diagnosis not present

## 2021-11-17 DIAGNOSIS — I739 Peripheral vascular disease, unspecified: Secondary | ICD-10-CM | POA: Diagnosis not present

## 2021-11-17 DIAGNOSIS — N1831 Chronic kidney disease, stage 3a: Secondary | ICD-10-CM | POA: Diagnosis not present

## 2021-11-17 DIAGNOSIS — I129 Hypertensive chronic kidney disease with stage 1 through stage 4 chronic kidney disease, or unspecified chronic kidney disease: Secondary | ICD-10-CM | POA: Diagnosis not present

## 2021-11-19 DIAGNOSIS — I129 Hypertensive chronic kidney disease with stage 1 through stage 4 chronic kidney disease, or unspecified chronic kidney disease: Secondary | ICD-10-CM | POA: Diagnosis not present

## 2021-11-19 DIAGNOSIS — G311 Senile degeneration of brain, not elsewhere classified: Secondary | ICD-10-CM | POA: Diagnosis not present

## 2021-11-19 DIAGNOSIS — N1831 Chronic kidney disease, stage 3a: Secondary | ICD-10-CM | POA: Diagnosis not present

## 2021-11-19 DIAGNOSIS — F0280 Dementia in other diseases classified elsewhere without behavioral disturbance: Secondary | ICD-10-CM | POA: Diagnosis not present

## 2021-11-19 DIAGNOSIS — E1142 Type 2 diabetes mellitus with diabetic polyneuropathy: Secondary | ICD-10-CM | POA: Diagnosis not present

## 2021-11-19 DIAGNOSIS — I739 Peripheral vascular disease, unspecified: Secondary | ICD-10-CM | POA: Diagnosis not present

## 2021-11-23 DIAGNOSIS — E1142 Type 2 diabetes mellitus with diabetic polyneuropathy: Secondary | ICD-10-CM | POA: Diagnosis not present

## 2021-11-23 DIAGNOSIS — I129 Hypertensive chronic kidney disease with stage 1 through stage 4 chronic kidney disease, or unspecified chronic kidney disease: Secondary | ICD-10-CM | POA: Diagnosis not present

## 2021-11-23 DIAGNOSIS — F0280 Dementia in other diseases classified elsewhere without behavioral disturbance: Secondary | ICD-10-CM | POA: Diagnosis not present

## 2021-11-23 DIAGNOSIS — G311 Senile degeneration of brain, not elsewhere classified: Secondary | ICD-10-CM | POA: Diagnosis not present

## 2021-11-23 DIAGNOSIS — I739 Peripheral vascular disease, unspecified: Secondary | ICD-10-CM | POA: Diagnosis not present

## 2021-11-23 DIAGNOSIS — N1831 Chronic kidney disease, stage 3a: Secondary | ICD-10-CM | POA: Diagnosis not present

## 2021-11-23 DIAGNOSIS — Z23 Encounter for immunization: Secondary | ICD-10-CM | POA: Diagnosis not present

## 2021-11-26 DIAGNOSIS — I739 Peripheral vascular disease, unspecified: Secondary | ICD-10-CM | POA: Diagnosis not present

## 2021-11-26 DIAGNOSIS — G311 Senile degeneration of brain, not elsewhere classified: Secondary | ICD-10-CM | POA: Diagnosis not present

## 2021-11-26 DIAGNOSIS — F0280 Dementia in other diseases classified elsewhere without behavioral disturbance: Secondary | ICD-10-CM | POA: Diagnosis not present

## 2021-11-26 DIAGNOSIS — E1142 Type 2 diabetes mellitus with diabetic polyneuropathy: Secondary | ICD-10-CM | POA: Diagnosis not present

## 2021-11-26 DIAGNOSIS — I129 Hypertensive chronic kidney disease with stage 1 through stage 4 chronic kidney disease, or unspecified chronic kidney disease: Secondary | ICD-10-CM | POA: Diagnosis not present

## 2021-11-26 DIAGNOSIS — N1831 Chronic kidney disease, stage 3a: Secondary | ICD-10-CM | POA: Diagnosis not present

## 2021-11-29 DIAGNOSIS — G311 Senile degeneration of brain, not elsewhere classified: Secondary | ICD-10-CM | POA: Diagnosis not present

## 2021-11-29 DIAGNOSIS — E1142 Type 2 diabetes mellitus with diabetic polyneuropathy: Secondary | ICD-10-CM | POA: Diagnosis not present

## 2021-11-29 DIAGNOSIS — N1831 Chronic kidney disease, stage 3a: Secondary | ICD-10-CM | POA: Diagnosis not present

## 2021-11-29 DIAGNOSIS — F0280 Dementia in other diseases classified elsewhere without behavioral disturbance: Secondary | ICD-10-CM | POA: Diagnosis not present

## 2021-11-29 DIAGNOSIS — I739 Peripheral vascular disease, unspecified: Secondary | ICD-10-CM | POA: Diagnosis not present

## 2021-11-29 DIAGNOSIS — I129 Hypertensive chronic kidney disease with stage 1 through stage 4 chronic kidney disease, or unspecified chronic kidney disease: Secondary | ICD-10-CM | POA: Diagnosis not present

## 2021-11-30 DIAGNOSIS — I129 Hypertensive chronic kidney disease with stage 1 through stage 4 chronic kidney disease, or unspecified chronic kidney disease: Secondary | ICD-10-CM | POA: Diagnosis not present

## 2021-11-30 DIAGNOSIS — G311 Senile degeneration of brain, not elsewhere classified: Secondary | ICD-10-CM | POA: Diagnosis not present

## 2021-11-30 DIAGNOSIS — F0280 Dementia in other diseases classified elsewhere without behavioral disturbance: Secondary | ICD-10-CM | POA: Diagnosis not present

## 2021-11-30 DIAGNOSIS — N1831 Chronic kidney disease, stage 3a: Secondary | ICD-10-CM | POA: Diagnosis not present

## 2021-11-30 DIAGNOSIS — E1142 Type 2 diabetes mellitus with diabetic polyneuropathy: Secondary | ICD-10-CM | POA: Diagnosis not present

## 2021-11-30 DIAGNOSIS — I739 Peripheral vascular disease, unspecified: Secondary | ICD-10-CM | POA: Diagnosis not present

## 2021-12-03 DIAGNOSIS — N1831 Chronic kidney disease, stage 3a: Secondary | ICD-10-CM | POA: Diagnosis not present

## 2021-12-03 DIAGNOSIS — F0280 Dementia in other diseases classified elsewhere without behavioral disturbance: Secondary | ICD-10-CM | POA: Diagnosis not present

## 2021-12-03 DIAGNOSIS — E1142 Type 2 diabetes mellitus with diabetic polyneuropathy: Secondary | ICD-10-CM | POA: Diagnosis not present

## 2021-12-03 DIAGNOSIS — G311 Senile degeneration of brain, not elsewhere classified: Secondary | ICD-10-CM | POA: Diagnosis not present

## 2021-12-03 DIAGNOSIS — I129 Hypertensive chronic kidney disease with stage 1 through stage 4 chronic kidney disease, or unspecified chronic kidney disease: Secondary | ICD-10-CM | POA: Diagnosis not present

## 2021-12-03 DIAGNOSIS — I739 Peripheral vascular disease, unspecified: Secondary | ICD-10-CM | POA: Diagnosis not present

## 2021-12-07 DIAGNOSIS — E1142 Type 2 diabetes mellitus with diabetic polyneuropathy: Secondary | ICD-10-CM | POA: Diagnosis not present

## 2021-12-07 DIAGNOSIS — F0280 Dementia in other diseases classified elsewhere without behavioral disturbance: Secondary | ICD-10-CM | POA: Diagnosis not present

## 2021-12-07 DIAGNOSIS — G311 Senile degeneration of brain, not elsewhere classified: Secondary | ICD-10-CM | POA: Diagnosis not present

## 2021-12-07 DIAGNOSIS — I129 Hypertensive chronic kidney disease with stage 1 through stage 4 chronic kidney disease, or unspecified chronic kidney disease: Secondary | ICD-10-CM | POA: Diagnosis not present

## 2021-12-07 DIAGNOSIS — N1831 Chronic kidney disease, stage 3a: Secondary | ICD-10-CM | POA: Diagnosis not present

## 2021-12-07 DIAGNOSIS — I739 Peripheral vascular disease, unspecified: Secondary | ICD-10-CM | POA: Diagnosis not present

## 2021-12-10 DIAGNOSIS — F0280 Dementia in other diseases classified elsewhere without behavioral disturbance: Secondary | ICD-10-CM | POA: Diagnosis not present

## 2021-12-10 DIAGNOSIS — E1142 Type 2 diabetes mellitus with diabetic polyneuropathy: Secondary | ICD-10-CM | POA: Diagnosis not present

## 2021-12-10 DIAGNOSIS — N1831 Chronic kidney disease, stage 3a: Secondary | ICD-10-CM | POA: Diagnosis not present

## 2021-12-10 DIAGNOSIS — I129 Hypertensive chronic kidney disease with stage 1 through stage 4 chronic kidney disease, or unspecified chronic kidney disease: Secondary | ICD-10-CM | POA: Diagnosis not present

## 2021-12-10 DIAGNOSIS — G311 Senile degeneration of brain, not elsewhere classified: Secondary | ICD-10-CM | POA: Diagnosis not present

## 2021-12-10 DIAGNOSIS — I739 Peripheral vascular disease, unspecified: Secondary | ICD-10-CM | POA: Diagnosis not present

## 2021-12-14 DIAGNOSIS — N1831 Chronic kidney disease, stage 3a: Secondary | ICD-10-CM | POA: Diagnosis not present

## 2021-12-14 DIAGNOSIS — I739 Peripheral vascular disease, unspecified: Secondary | ICD-10-CM | POA: Diagnosis not present

## 2021-12-14 DIAGNOSIS — F0280 Dementia in other diseases classified elsewhere without behavioral disturbance: Secondary | ICD-10-CM | POA: Diagnosis not present

## 2021-12-14 DIAGNOSIS — E1142 Type 2 diabetes mellitus with diabetic polyneuropathy: Secondary | ICD-10-CM | POA: Diagnosis not present

## 2021-12-14 DIAGNOSIS — G311 Senile degeneration of brain, not elsewhere classified: Secondary | ICD-10-CM | POA: Diagnosis not present

## 2021-12-14 DIAGNOSIS — I129 Hypertensive chronic kidney disease with stage 1 through stage 4 chronic kidney disease, or unspecified chronic kidney disease: Secondary | ICD-10-CM | POA: Diagnosis not present

## 2021-12-15 DIAGNOSIS — Z8673 Personal history of transient ischemic attack (TIA), and cerebral infarction without residual deficits: Secondary | ICD-10-CM | POA: Diagnosis not present

## 2021-12-15 DIAGNOSIS — G311 Senile degeneration of brain, not elsewhere classified: Secondary | ICD-10-CM | POA: Diagnosis not present

## 2021-12-15 DIAGNOSIS — F0280 Dementia in other diseases classified elsewhere without behavioral disturbance: Secondary | ICD-10-CM | POA: Diagnosis not present

## 2021-12-15 DIAGNOSIS — E039 Hypothyroidism, unspecified: Secondary | ICD-10-CM | POA: Diagnosis not present

## 2021-12-15 DIAGNOSIS — L89152 Pressure ulcer of sacral region, stage 2: Secondary | ICD-10-CM | POA: Diagnosis not present

## 2021-12-15 DIAGNOSIS — E1142 Type 2 diabetes mellitus with diabetic polyneuropathy: Secondary | ICD-10-CM | POA: Diagnosis not present

## 2021-12-15 DIAGNOSIS — I129 Hypertensive chronic kidney disease with stage 1 through stage 4 chronic kidney disease, or unspecified chronic kidney disease: Secondary | ICD-10-CM | POA: Diagnosis not present

## 2021-12-15 DIAGNOSIS — I739 Peripheral vascular disease, unspecified: Secondary | ICD-10-CM | POA: Diagnosis not present

## 2021-12-15 DIAGNOSIS — F329 Major depressive disorder, single episode, unspecified: Secondary | ICD-10-CM | POA: Diagnosis not present

## 2021-12-15 DIAGNOSIS — E785 Hyperlipidemia, unspecified: Secondary | ICD-10-CM | POA: Diagnosis not present

## 2021-12-15 DIAGNOSIS — R269 Unspecified abnormalities of gait and mobility: Secondary | ICD-10-CM | POA: Diagnosis not present

## 2021-12-15 DIAGNOSIS — N1831 Chronic kidney disease, stage 3a: Secondary | ICD-10-CM | POA: Diagnosis not present

## 2021-12-17 DIAGNOSIS — I739 Peripheral vascular disease, unspecified: Secondary | ICD-10-CM | POA: Diagnosis not present

## 2021-12-17 DIAGNOSIS — E1142 Type 2 diabetes mellitus with diabetic polyneuropathy: Secondary | ICD-10-CM | POA: Diagnosis not present

## 2021-12-17 DIAGNOSIS — F0280 Dementia in other diseases classified elsewhere without behavioral disturbance: Secondary | ICD-10-CM | POA: Diagnosis not present

## 2021-12-17 DIAGNOSIS — G311 Senile degeneration of brain, not elsewhere classified: Secondary | ICD-10-CM | POA: Diagnosis not present

## 2021-12-17 DIAGNOSIS — I129 Hypertensive chronic kidney disease with stage 1 through stage 4 chronic kidney disease, or unspecified chronic kidney disease: Secondary | ICD-10-CM | POA: Diagnosis not present

## 2021-12-17 DIAGNOSIS — N1831 Chronic kidney disease, stage 3a: Secondary | ICD-10-CM | POA: Diagnosis not present

## 2021-12-21 DIAGNOSIS — I739 Peripheral vascular disease, unspecified: Secondary | ICD-10-CM | POA: Diagnosis not present

## 2021-12-21 DIAGNOSIS — I129 Hypertensive chronic kidney disease with stage 1 through stage 4 chronic kidney disease, or unspecified chronic kidney disease: Secondary | ICD-10-CM | POA: Diagnosis not present

## 2021-12-21 DIAGNOSIS — F0280 Dementia in other diseases classified elsewhere without behavioral disturbance: Secondary | ICD-10-CM | POA: Diagnosis not present

## 2021-12-21 DIAGNOSIS — E1142 Type 2 diabetes mellitus with diabetic polyneuropathy: Secondary | ICD-10-CM | POA: Diagnosis not present

## 2021-12-21 DIAGNOSIS — N1831 Chronic kidney disease, stage 3a: Secondary | ICD-10-CM | POA: Diagnosis not present

## 2021-12-21 DIAGNOSIS — G311 Senile degeneration of brain, not elsewhere classified: Secondary | ICD-10-CM | POA: Diagnosis not present

## 2021-12-24 DIAGNOSIS — E1142 Type 2 diabetes mellitus with diabetic polyneuropathy: Secondary | ICD-10-CM | POA: Diagnosis not present

## 2021-12-24 DIAGNOSIS — G311 Senile degeneration of brain, not elsewhere classified: Secondary | ICD-10-CM | POA: Diagnosis not present

## 2021-12-24 DIAGNOSIS — I739 Peripheral vascular disease, unspecified: Secondary | ICD-10-CM | POA: Diagnosis not present

## 2021-12-24 DIAGNOSIS — I129 Hypertensive chronic kidney disease with stage 1 through stage 4 chronic kidney disease, or unspecified chronic kidney disease: Secondary | ICD-10-CM | POA: Diagnosis not present

## 2021-12-24 DIAGNOSIS — N1831 Chronic kidney disease, stage 3a: Secondary | ICD-10-CM | POA: Diagnosis not present

## 2021-12-24 DIAGNOSIS — F0280 Dementia in other diseases classified elsewhere without behavioral disturbance: Secondary | ICD-10-CM | POA: Diagnosis not present

## 2021-12-28 DIAGNOSIS — N1831 Chronic kidney disease, stage 3a: Secondary | ICD-10-CM | POA: Diagnosis not present

## 2021-12-28 DIAGNOSIS — G311 Senile degeneration of brain, not elsewhere classified: Secondary | ICD-10-CM | POA: Diagnosis not present

## 2021-12-28 DIAGNOSIS — E1142 Type 2 diabetes mellitus with diabetic polyneuropathy: Secondary | ICD-10-CM | POA: Diagnosis not present

## 2021-12-28 DIAGNOSIS — I739 Peripheral vascular disease, unspecified: Secondary | ICD-10-CM | POA: Diagnosis not present

## 2021-12-28 DIAGNOSIS — I129 Hypertensive chronic kidney disease with stage 1 through stage 4 chronic kidney disease, or unspecified chronic kidney disease: Secondary | ICD-10-CM | POA: Diagnosis not present

## 2021-12-28 DIAGNOSIS — F0280 Dementia in other diseases classified elsewhere without behavioral disturbance: Secondary | ICD-10-CM | POA: Diagnosis not present

## 2021-12-31 DIAGNOSIS — N1831 Chronic kidney disease, stage 3a: Secondary | ICD-10-CM | POA: Diagnosis not present

## 2021-12-31 DIAGNOSIS — G311 Senile degeneration of brain, not elsewhere classified: Secondary | ICD-10-CM | POA: Diagnosis not present

## 2021-12-31 DIAGNOSIS — E1142 Type 2 diabetes mellitus with diabetic polyneuropathy: Secondary | ICD-10-CM | POA: Diagnosis not present

## 2021-12-31 DIAGNOSIS — I129 Hypertensive chronic kidney disease with stage 1 through stage 4 chronic kidney disease, or unspecified chronic kidney disease: Secondary | ICD-10-CM | POA: Diagnosis not present

## 2021-12-31 DIAGNOSIS — F0280 Dementia in other diseases classified elsewhere without behavioral disturbance: Secondary | ICD-10-CM | POA: Diagnosis not present

## 2021-12-31 DIAGNOSIS — I739 Peripheral vascular disease, unspecified: Secondary | ICD-10-CM | POA: Diagnosis not present

## 2022-01-04 DIAGNOSIS — I129 Hypertensive chronic kidney disease with stage 1 through stage 4 chronic kidney disease, or unspecified chronic kidney disease: Secondary | ICD-10-CM | POA: Diagnosis not present

## 2022-01-04 DIAGNOSIS — E1142 Type 2 diabetes mellitus with diabetic polyneuropathy: Secondary | ICD-10-CM | POA: Diagnosis not present

## 2022-01-04 DIAGNOSIS — F0280 Dementia in other diseases classified elsewhere without behavioral disturbance: Secondary | ICD-10-CM | POA: Diagnosis not present

## 2022-01-04 DIAGNOSIS — N1831 Chronic kidney disease, stage 3a: Secondary | ICD-10-CM | POA: Diagnosis not present

## 2022-01-04 DIAGNOSIS — G311 Senile degeneration of brain, not elsewhere classified: Secondary | ICD-10-CM | POA: Diagnosis not present

## 2022-01-04 DIAGNOSIS — I739 Peripheral vascular disease, unspecified: Secondary | ICD-10-CM | POA: Diagnosis not present

## 2022-01-07 DIAGNOSIS — I739 Peripheral vascular disease, unspecified: Secondary | ICD-10-CM | POA: Diagnosis not present

## 2022-01-07 DIAGNOSIS — I129 Hypertensive chronic kidney disease with stage 1 through stage 4 chronic kidney disease, or unspecified chronic kidney disease: Secondary | ICD-10-CM | POA: Diagnosis not present

## 2022-01-07 DIAGNOSIS — E1142 Type 2 diabetes mellitus with diabetic polyneuropathy: Secondary | ICD-10-CM | POA: Diagnosis not present

## 2022-01-07 DIAGNOSIS — N1831 Chronic kidney disease, stage 3a: Secondary | ICD-10-CM | POA: Diagnosis not present

## 2022-01-07 DIAGNOSIS — F0280 Dementia in other diseases classified elsewhere without behavioral disturbance: Secondary | ICD-10-CM | POA: Diagnosis not present

## 2022-01-07 DIAGNOSIS — G311 Senile degeneration of brain, not elsewhere classified: Secondary | ICD-10-CM | POA: Diagnosis not present

## 2022-01-11 DIAGNOSIS — I129 Hypertensive chronic kidney disease with stage 1 through stage 4 chronic kidney disease, or unspecified chronic kidney disease: Secondary | ICD-10-CM | POA: Diagnosis not present

## 2022-01-11 DIAGNOSIS — N1831 Chronic kidney disease, stage 3a: Secondary | ICD-10-CM | POA: Diagnosis not present

## 2022-01-11 DIAGNOSIS — G311 Senile degeneration of brain, not elsewhere classified: Secondary | ICD-10-CM | POA: Diagnosis not present

## 2022-01-11 DIAGNOSIS — I739 Peripheral vascular disease, unspecified: Secondary | ICD-10-CM | POA: Diagnosis not present

## 2022-01-11 DIAGNOSIS — E1142 Type 2 diabetes mellitus with diabetic polyneuropathy: Secondary | ICD-10-CM | POA: Diagnosis not present

## 2022-01-11 DIAGNOSIS — F0280 Dementia in other diseases classified elsewhere without behavioral disturbance: Secondary | ICD-10-CM | POA: Diagnosis not present

## 2022-01-14 DIAGNOSIS — E1142 Type 2 diabetes mellitus with diabetic polyneuropathy: Secondary | ICD-10-CM | POA: Diagnosis not present

## 2022-01-14 DIAGNOSIS — E039 Hypothyroidism, unspecified: Secondary | ICD-10-CM | POA: Diagnosis not present

## 2022-01-14 DIAGNOSIS — F0280 Dementia in other diseases classified elsewhere without behavioral disturbance: Secondary | ICD-10-CM | POA: Diagnosis not present

## 2022-01-14 DIAGNOSIS — L89152 Pressure ulcer of sacral region, stage 2: Secondary | ICD-10-CM | POA: Diagnosis not present

## 2022-01-14 DIAGNOSIS — I129 Hypertensive chronic kidney disease with stage 1 through stage 4 chronic kidney disease, or unspecified chronic kidney disease: Secondary | ICD-10-CM | POA: Diagnosis not present

## 2022-01-14 DIAGNOSIS — N1831 Chronic kidney disease, stage 3a: Secondary | ICD-10-CM | POA: Diagnosis not present

## 2022-01-14 DIAGNOSIS — I739 Peripheral vascular disease, unspecified: Secondary | ICD-10-CM | POA: Diagnosis not present

## 2022-01-14 DIAGNOSIS — F329 Major depressive disorder, single episode, unspecified: Secondary | ICD-10-CM | POA: Diagnosis not present

## 2022-01-14 DIAGNOSIS — E785 Hyperlipidemia, unspecified: Secondary | ICD-10-CM | POA: Diagnosis not present

## 2022-01-14 DIAGNOSIS — Z8673 Personal history of transient ischemic attack (TIA), and cerebral infarction without residual deficits: Secondary | ICD-10-CM | POA: Diagnosis not present

## 2022-01-14 DIAGNOSIS — R269 Unspecified abnormalities of gait and mobility: Secondary | ICD-10-CM | POA: Diagnosis not present

## 2022-01-14 DIAGNOSIS — G311 Senile degeneration of brain, not elsewhere classified: Secondary | ICD-10-CM | POA: Diagnosis not present

## 2022-01-18 DIAGNOSIS — G311 Senile degeneration of brain, not elsewhere classified: Secondary | ICD-10-CM | POA: Diagnosis not present

## 2022-01-18 DIAGNOSIS — I739 Peripheral vascular disease, unspecified: Secondary | ICD-10-CM | POA: Diagnosis not present

## 2022-01-18 DIAGNOSIS — N1831 Chronic kidney disease, stage 3a: Secondary | ICD-10-CM | POA: Diagnosis not present

## 2022-01-18 DIAGNOSIS — I129 Hypertensive chronic kidney disease with stage 1 through stage 4 chronic kidney disease, or unspecified chronic kidney disease: Secondary | ICD-10-CM | POA: Diagnosis not present

## 2022-01-18 DIAGNOSIS — E1142 Type 2 diabetes mellitus with diabetic polyneuropathy: Secondary | ICD-10-CM | POA: Diagnosis not present

## 2022-01-18 DIAGNOSIS — F0280 Dementia in other diseases classified elsewhere without behavioral disturbance: Secondary | ICD-10-CM | POA: Diagnosis not present

## 2022-01-21 DIAGNOSIS — E1142 Type 2 diabetes mellitus with diabetic polyneuropathy: Secondary | ICD-10-CM | POA: Diagnosis not present

## 2022-01-21 DIAGNOSIS — N1831 Chronic kidney disease, stage 3a: Secondary | ICD-10-CM | POA: Diagnosis not present

## 2022-01-21 DIAGNOSIS — I129 Hypertensive chronic kidney disease with stage 1 through stage 4 chronic kidney disease, or unspecified chronic kidney disease: Secondary | ICD-10-CM | POA: Diagnosis not present

## 2022-01-21 DIAGNOSIS — I739 Peripheral vascular disease, unspecified: Secondary | ICD-10-CM | POA: Diagnosis not present

## 2022-01-21 DIAGNOSIS — F0280 Dementia in other diseases classified elsewhere without behavioral disturbance: Secondary | ICD-10-CM | POA: Diagnosis not present

## 2022-01-21 DIAGNOSIS — G311 Senile degeneration of brain, not elsewhere classified: Secondary | ICD-10-CM | POA: Diagnosis not present

## 2022-01-25 DIAGNOSIS — E1142 Type 2 diabetes mellitus with diabetic polyneuropathy: Secondary | ICD-10-CM | POA: Diagnosis not present

## 2022-01-25 DIAGNOSIS — G311 Senile degeneration of brain, not elsewhere classified: Secondary | ICD-10-CM | POA: Diagnosis not present

## 2022-01-25 DIAGNOSIS — I739 Peripheral vascular disease, unspecified: Secondary | ICD-10-CM | POA: Diagnosis not present

## 2022-01-25 DIAGNOSIS — I129 Hypertensive chronic kidney disease with stage 1 through stage 4 chronic kidney disease, or unspecified chronic kidney disease: Secondary | ICD-10-CM | POA: Diagnosis not present

## 2022-01-25 DIAGNOSIS — N1831 Chronic kidney disease, stage 3a: Secondary | ICD-10-CM | POA: Diagnosis not present

## 2022-01-25 DIAGNOSIS — F0280 Dementia in other diseases classified elsewhere without behavioral disturbance: Secondary | ICD-10-CM | POA: Diagnosis not present

## 2022-01-28 DIAGNOSIS — E1142 Type 2 diabetes mellitus with diabetic polyneuropathy: Secondary | ICD-10-CM | POA: Diagnosis not present

## 2022-01-28 DIAGNOSIS — I739 Peripheral vascular disease, unspecified: Secondary | ICD-10-CM | POA: Diagnosis not present

## 2022-01-28 DIAGNOSIS — G311 Senile degeneration of brain, not elsewhere classified: Secondary | ICD-10-CM | POA: Diagnosis not present

## 2022-01-28 DIAGNOSIS — F0280 Dementia in other diseases classified elsewhere without behavioral disturbance: Secondary | ICD-10-CM | POA: Diagnosis not present

## 2022-01-28 DIAGNOSIS — N1831 Chronic kidney disease, stage 3a: Secondary | ICD-10-CM | POA: Diagnosis not present

## 2022-01-28 DIAGNOSIS — I129 Hypertensive chronic kidney disease with stage 1 through stage 4 chronic kidney disease, or unspecified chronic kidney disease: Secondary | ICD-10-CM | POA: Diagnosis not present

## 2022-02-01 DIAGNOSIS — G311 Senile degeneration of brain, not elsewhere classified: Secondary | ICD-10-CM | POA: Diagnosis not present

## 2022-02-01 DIAGNOSIS — N1831 Chronic kidney disease, stage 3a: Secondary | ICD-10-CM | POA: Diagnosis not present

## 2022-02-01 DIAGNOSIS — F0280 Dementia in other diseases classified elsewhere without behavioral disturbance: Secondary | ICD-10-CM | POA: Diagnosis not present

## 2022-02-01 DIAGNOSIS — I739 Peripheral vascular disease, unspecified: Secondary | ICD-10-CM | POA: Diagnosis not present

## 2022-02-01 DIAGNOSIS — I129 Hypertensive chronic kidney disease with stage 1 through stage 4 chronic kidney disease, or unspecified chronic kidney disease: Secondary | ICD-10-CM | POA: Diagnosis not present

## 2022-02-01 DIAGNOSIS — E1142 Type 2 diabetes mellitus with diabetic polyneuropathy: Secondary | ICD-10-CM | POA: Diagnosis not present

## 2022-02-04 DIAGNOSIS — E1142 Type 2 diabetes mellitus with diabetic polyneuropathy: Secondary | ICD-10-CM | POA: Diagnosis not present

## 2022-02-04 DIAGNOSIS — I129 Hypertensive chronic kidney disease with stage 1 through stage 4 chronic kidney disease, or unspecified chronic kidney disease: Secondary | ICD-10-CM | POA: Diagnosis not present

## 2022-02-04 DIAGNOSIS — F0280 Dementia in other diseases classified elsewhere without behavioral disturbance: Secondary | ICD-10-CM | POA: Diagnosis not present

## 2022-02-04 DIAGNOSIS — N1831 Chronic kidney disease, stage 3a: Secondary | ICD-10-CM | POA: Diagnosis not present

## 2022-02-04 DIAGNOSIS — I739 Peripheral vascular disease, unspecified: Secondary | ICD-10-CM | POA: Diagnosis not present

## 2022-02-04 DIAGNOSIS — G311 Senile degeneration of brain, not elsewhere classified: Secondary | ICD-10-CM | POA: Diagnosis not present

## 2022-02-08 DIAGNOSIS — I129 Hypertensive chronic kidney disease with stage 1 through stage 4 chronic kidney disease, or unspecified chronic kidney disease: Secondary | ICD-10-CM | POA: Diagnosis not present

## 2022-02-08 DIAGNOSIS — G311 Senile degeneration of brain, not elsewhere classified: Secondary | ICD-10-CM | POA: Diagnosis not present

## 2022-02-08 DIAGNOSIS — I739 Peripheral vascular disease, unspecified: Secondary | ICD-10-CM | POA: Diagnosis not present

## 2022-02-08 DIAGNOSIS — E1142 Type 2 diabetes mellitus with diabetic polyneuropathy: Secondary | ICD-10-CM | POA: Diagnosis not present

## 2022-02-08 DIAGNOSIS — N1831 Chronic kidney disease, stage 3a: Secondary | ICD-10-CM | POA: Diagnosis not present

## 2022-02-08 DIAGNOSIS — F0280 Dementia in other diseases classified elsewhere without behavioral disturbance: Secondary | ICD-10-CM | POA: Diagnosis not present

## 2022-02-11 DIAGNOSIS — I739 Peripheral vascular disease, unspecified: Secondary | ICD-10-CM | POA: Diagnosis not present

## 2022-02-11 DIAGNOSIS — F0280 Dementia in other diseases classified elsewhere without behavioral disturbance: Secondary | ICD-10-CM | POA: Diagnosis not present

## 2022-02-11 DIAGNOSIS — G311 Senile degeneration of brain, not elsewhere classified: Secondary | ICD-10-CM | POA: Diagnosis not present

## 2022-02-11 DIAGNOSIS — E1142 Type 2 diabetes mellitus with diabetic polyneuropathy: Secondary | ICD-10-CM | POA: Diagnosis not present

## 2022-02-11 DIAGNOSIS — I129 Hypertensive chronic kidney disease with stage 1 through stage 4 chronic kidney disease, or unspecified chronic kidney disease: Secondary | ICD-10-CM | POA: Diagnosis not present

## 2022-02-11 DIAGNOSIS — N1831 Chronic kidney disease, stage 3a: Secondary | ICD-10-CM | POA: Diagnosis not present

## 2022-02-14 DIAGNOSIS — E039 Hypothyroidism, unspecified: Secondary | ICD-10-CM | POA: Diagnosis not present

## 2022-02-14 DIAGNOSIS — E1142 Type 2 diabetes mellitus with diabetic polyneuropathy: Secondary | ICD-10-CM | POA: Diagnosis not present

## 2022-02-14 DIAGNOSIS — N1831 Chronic kidney disease, stage 3a: Secondary | ICD-10-CM | POA: Diagnosis not present

## 2022-02-14 DIAGNOSIS — I739 Peripheral vascular disease, unspecified: Secondary | ICD-10-CM | POA: Diagnosis not present

## 2022-02-14 DIAGNOSIS — L89152 Pressure ulcer of sacral region, stage 2: Secondary | ICD-10-CM | POA: Diagnosis not present

## 2022-02-14 DIAGNOSIS — I129 Hypertensive chronic kidney disease with stage 1 through stage 4 chronic kidney disease, or unspecified chronic kidney disease: Secondary | ICD-10-CM | POA: Diagnosis not present

## 2022-02-14 DIAGNOSIS — R269 Unspecified abnormalities of gait and mobility: Secondary | ICD-10-CM | POA: Diagnosis not present

## 2022-02-14 DIAGNOSIS — F0280 Dementia in other diseases classified elsewhere without behavioral disturbance: Secondary | ICD-10-CM | POA: Diagnosis not present

## 2022-02-14 DIAGNOSIS — G311 Senile degeneration of brain, not elsewhere classified: Secondary | ICD-10-CM | POA: Diagnosis not present

## 2022-02-14 DIAGNOSIS — F329 Major depressive disorder, single episode, unspecified: Secondary | ICD-10-CM | POA: Diagnosis not present

## 2022-02-14 DIAGNOSIS — Z8673 Personal history of transient ischemic attack (TIA), and cerebral infarction without residual deficits: Secondary | ICD-10-CM | POA: Diagnosis not present

## 2022-02-14 DIAGNOSIS — E785 Hyperlipidemia, unspecified: Secondary | ICD-10-CM | POA: Diagnosis not present

## 2022-02-15 DIAGNOSIS — I739 Peripheral vascular disease, unspecified: Secondary | ICD-10-CM | POA: Diagnosis not present

## 2022-02-15 DIAGNOSIS — N1831 Chronic kidney disease, stage 3a: Secondary | ICD-10-CM | POA: Diagnosis not present

## 2022-02-15 DIAGNOSIS — I129 Hypertensive chronic kidney disease with stage 1 through stage 4 chronic kidney disease, or unspecified chronic kidney disease: Secondary | ICD-10-CM | POA: Diagnosis not present

## 2022-02-15 DIAGNOSIS — F0280 Dementia in other diseases classified elsewhere without behavioral disturbance: Secondary | ICD-10-CM | POA: Diagnosis not present

## 2022-02-15 DIAGNOSIS — G311 Senile degeneration of brain, not elsewhere classified: Secondary | ICD-10-CM | POA: Diagnosis not present

## 2022-02-15 DIAGNOSIS — E1142 Type 2 diabetes mellitus with diabetic polyneuropathy: Secondary | ICD-10-CM | POA: Diagnosis not present

## 2022-02-18 DIAGNOSIS — F0280 Dementia in other diseases classified elsewhere without behavioral disturbance: Secondary | ICD-10-CM | POA: Diagnosis not present

## 2022-02-18 DIAGNOSIS — E1142 Type 2 diabetes mellitus with diabetic polyneuropathy: Secondary | ICD-10-CM | POA: Diagnosis not present

## 2022-02-18 DIAGNOSIS — I739 Peripheral vascular disease, unspecified: Secondary | ICD-10-CM | POA: Diagnosis not present

## 2022-02-18 DIAGNOSIS — I129 Hypertensive chronic kidney disease with stage 1 through stage 4 chronic kidney disease, or unspecified chronic kidney disease: Secondary | ICD-10-CM | POA: Diagnosis not present

## 2022-02-18 DIAGNOSIS — N1831 Chronic kidney disease, stage 3a: Secondary | ICD-10-CM | POA: Diagnosis not present

## 2022-02-18 DIAGNOSIS — G311 Senile degeneration of brain, not elsewhere classified: Secondary | ICD-10-CM | POA: Diagnosis not present

## 2022-02-22 DIAGNOSIS — N1831 Chronic kidney disease, stage 3a: Secondary | ICD-10-CM | POA: Diagnosis not present

## 2022-02-22 DIAGNOSIS — I739 Peripheral vascular disease, unspecified: Secondary | ICD-10-CM | POA: Diagnosis not present

## 2022-02-22 DIAGNOSIS — I129 Hypertensive chronic kidney disease with stage 1 through stage 4 chronic kidney disease, or unspecified chronic kidney disease: Secondary | ICD-10-CM | POA: Diagnosis not present

## 2022-02-22 DIAGNOSIS — F0280 Dementia in other diseases classified elsewhere without behavioral disturbance: Secondary | ICD-10-CM | POA: Diagnosis not present

## 2022-02-22 DIAGNOSIS — G311 Senile degeneration of brain, not elsewhere classified: Secondary | ICD-10-CM | POA: Diagnosis not present

## 2022-02-22 DIAGNOSIS — E1142 Type 2 diabetes mellitus with diabetic polyneuropathy: Secondary | ICD-10-CM | POA: Diagnosis not present

## 2022-02-25 DIAGNOSIS — I739 Peripheral vascular disease, unspecified: Secondary | ICD-10-CM | POA: Diagnosis not present

## 2022-02-25 DIAGNOSIS — N1831 Chronic kidney disease, stage 3a: Secondary | ICD-10-CM | POA: Diagnosis not present

## 2022-02-25 DIAGNOSIS — G311 Senile degeneration of brain, not elsewhere classified: Secondary | ICD-10-CM | POA: Diagnosis not present

## 2022-02-25 DIAGNOSIS — F0280 Dementia in other diseases classified elsewhere without behavioral disturbance: Secondary | ICD-10-CM | POA: Diagnosis not present

## 2022-02-25 DIAGNOSIS — I129 Hypertensive chronic kidney disease with stage 1 through stage 4 chronic kidney disease, or unspecified chronic kidney disease: Secondary | ICD-10-CM | POA: Diagnosis not present

## 2022-02-25 DIAGNOSIS — E1142 Type 2 diabetes mellitus with diabetic polyneuropathy: Secondary | ICD-10-CM | POA: Diagnosis not present

## 2022-03-01 DIAGNOSIS — I739 Peripheral vascular disease, unspecified: Secondary | ICD-10-CM | POA: Diagnosis not present

## 2022-03-01 DIAGNOSIS — F0280 Dementia in other diseases classified elsewhere without behavioral disturbance: Secondary | ICD-10-CM | POA: Diagnosis not present

## 2022-03-01 DIAGNOSIS — N1831 Chronic kidney disease, stage 3a: Secondary | ICD-10-CM | POA: Diagnosis not present

## 2022-03-01 DIAGNOSIS — G311 Senile degeneration of brain, not elsewhere classified: Secondary | ICD-10-CM | POA: Diagnosis not present

## 2022-03-01 DIAGNOSIS — E1142 Type 2 diabetes mellitus with diabetic polyneuropathy: Secondary | ICD-10-CM | POA: Diagnosis not present

## 2022-03-01 DIAGNOSIS — I129 Hypertensive chronic kidney disease with stage 1 through stage 4 chronic kidney disease, or unspecified chronic kidney disease: Secondary | ICD-10-CM | POA: Diagnosis not present

## 2022-03-04 DIAGNOSIS — N1831 Chronic kidney disease, stage 3a: Secondary | ICD-10-CM | POA: Diagnosis not present

## 2022-03-04 DIAGNOSIS — I739 Peripheral vascular disease, unspecified: Secondary | ICD-10-CM | POA: Diagnosis not present

## 2022-03-04 DIAGNOSIS — F0280 Dementia in other diseases classified elsewhere without behavioral disturbance: Secondary | ICD-10-CM | POA: Diagnosis not present

## 2022-03-04 DIAGNOSIS — I129 Hypertensive chronic kidney disease with stage 1 through stage 4 chronic kidney disease, or unspecified chronic kidney disease: Secondary | ICD-10-CM | POA: Diagnosis not present

## 2022-03-04 DIAGNOSIS — E1142 Type 2 diabetes mellitus with diabetic polyneuropathy: Secondary | ICD-10-CM | POA: Diagnosis not present

## 2022-03-04 DIAGNOSIS — G311 Senile degeneration of brain, not elsewhere classified: Secondary | ICD-10-CM | POA: Diagnosis not present

## 2022-03-08 DIAGNOSIS — E1142 Type 2 diabetes mellitus with diabetic polyneuropathy: Secondary | ICD-10-CM | POA: Diagnosis not present

## 2022-03-08 DIAGNOSIS — I739 Peripheral vascular disease, unspecified: Secondary | ICD-10-CM | POA: Diagnosis not present

## 2022-03-08 DIAGNOSIS — F0280 Dementia in other diseases classified elsewhere without behavioral disturbance: Secondary | ICD-10-CM | POA: Diagnosis not present

## 2022-03-08 DIAGNOSIS — I129 Hypertensive chronic kidney disease with stage 1 through stage 4 chronic kidney disease, or unspecified chronic kidney disease: Secondary | ICD-10-CM | POA: Diagnosis not present

## 2022-03-08 DIAGNOSIS — G311 Senile degeneration of brain, not elsewhere classified: Secondary | ICD-10-CM | POA: Diagnosis not present

## 2022-03-08 DIAGNOSIS — N1831 Chronic kidney disease, stage 3a: Secondary | ICD-10-CM | POA: Diagnosis not present

## 2022-03-11 DIAGNOSIS — N1831 Chronic kidney disease, stage 3a: Secondary | ICD-10-CM | POA: Diagnosis not present

## 2022-03-11 DIAGNOSIS — G311 Senile degeneration of brain, not elsewhere classified: Secondary | ICD-10-CM | POA: Diagnosis not present

## 2022-03-11 DIAGNOSIS — I129 Hypertensive chronic kidney disease with stage 1 through stage 4 chronic kidney disease, or unspecified chronic kidney disease: Secondary | ICD-10-CM | POA: Diagnosis not present

## 2022-03-11 DIAGNOSIS — E1142 Type 2 diabetes mellitus with diabetic polyneuropathy: Secondary | ICD-10-CM | POA: Diagnosis not present

## 2022-03-11 DIAGNOSIS — I739 Peripheral vascular disease, unspecified: Secondary | ICD-10-CM | POA: Diagnosis not present

## 2022-03-11 DIAGNOSIS — F0280 Dementia in other diseases classified elsewhere without behavioral disturbance: Secondary | ICD-10-CM | POA: Diagnosis not present

## 2022-03-15 DIAGNOSIS — N1831 Chronic kidney disease, stage 3a: Secondary | ICD-10-CM | POA: Diagnosis not present

## 2022-03-15 DIAGNOSIS — E1142 Type 2 diabetes mellitus with diabetic polyneuropathy: Secondary | ICD-10-CM | POA: Diagnosis not present

## 2022-03-15 DIAGNOSIS — G311 Senile degeneration of brain, not elsewhere classified: Secondary | ICD-10-CM | POA: Diagnosis not present

## 2022-03-15 DIAGNOSIS — I739 Peripheral vascular disease, unspecified: Secondary | ICD-10-CM | POA: Diagnosis not present

## 2022-03-15 DIAGNOSIS — I129 Hypertensive chronic kidney disease with stage 1 through stage 4 chronic kidney disease, or unspecified chronic kidney disease: Secondary | ICD-10-CM | POA: Diagnosis not present

## 2022-03-15 DIAGNOSIS — F0280 Dementia in other diseases classified elsewhere without behavioral disturbance: Secondary | ICD-10-CM | POA: Diagnosis not present

## 2022-03-17 DIAGNOSIS — N1831 Chronic kidney disease, stage 3a: Secondary | ICD-10-CM | POA: Diagnosis not present

## 2022-03-17 DIAGNOSIS — F329 Major depressive disorder, single episode, unspecified: Secondary | ICD-10-CM | POA: Diagnosis not present

## 2022-03-17 DIAGNOSIS — E1142 Type 2 diabetes mellitus with diabetic polyneuropathy: Secondary | ICD-10-CM | POA: Diagnosis not present

## 2022-03-17 DIAGNOSIS — R269 Unspecified abnormalities of gait and mobility: Secondary | ICD-10-CM | POA: Diagnosis not present

## 2022-03-17 DIAGNOSIS — F0280 Dementia in other diseases classified elsewhere without behavioral disturbance: Secondary | ICD-10-CM | POA: Diagnosis not present

## 2022-03-17 DIAGNOSIS — G311 Senile degeneration of brain, not elsewhere classified: Secondary | ICD-10-CM | POA: Diagnosis not present

## 2022-03-17 DIAGNOSIS — L89152 Pressure ulcer of sacral region, stage 2: Secondary | ICD-10-CM | POA: Diagnosis not present

## 2022-03-17 DIAGNOSIS — Z8673 Personal history of transient ischemic attack (TIA), and cerebral infarction without residual deficits: Secondary | ICD-10-CM | POA: Diagnosis not present

## 2022-03-17 DIAGNOSIS — E785 Hyperlipidemia, unspecified: Secondary | ICD-10-CM | POA: Diagnosis not present

## 2022-03-17 DIAGNOSIS — I739 Peripheral vascular disease, unspecified: Secondary | ICD-10-CM | POA: Diagnosis not present

## 2022-03-17 DIAGNOSIS — E039 Hypothyroidism, unspecified: Secondary | ICD-10-CM | POA: Diagnosis not present

## 2022-03-17 DIAGNOSIS — I129 Hypertensive chronic kidney disease with stage 1 through stage 4 chronic kidney disease, or unspecified chronic kidney disease: Secondary | ICD-10-CM | POA: Diagnosis not present

## 2022-03-18 DIAGNOSIS — N1831 Chronic kidney disease, stage 3a: Secondary | ICD-10-CM | POA: Diagnosis not present

## 2022-03-18 DIAGNOSIS — G311 Senile degeneration of brain, not elsewhere classified: Secondary | ICD-10-CM | POA: Diagnosis not present

## 2022-03-18 DIAGNOSIS — E1142 Type 2 diabetes mellitus with diabetic polyneuropathy: Secondary | ICD-10-CM | POA: Diagnosis not present

## 2022-03-18 DIAGNOSIS — I739 Peripheral vascular disease, unspecified: Secondary | ICD-10-CM | POA: Diagnosis not present

## 2022-03-18 DIAGNOSIS — F0280 Dementia in other diseases classified elsewhere without behavioral disturbance: Secondary | ICD-10-CM | POA: Diagnosis not present

## 2022-03-18 DIAGNOSIS — I129 Hypertensive chronic kidney disease with stage 1 through stage 4 chronic kidney disease, or unspecified chronic kidney disease: Secondary | ICD-10-CM | POA: Diagnosis not present

## 2022-03-22 DIAGNOSIS — E1142 Type 2 diabetes mellitus with diabetic polyneuropathy: Secondary | ICD-10-CM | POA: Diagnosis not present

## 2022-03-22 DIAGNOSIS — N1831 Chronic kidney disease, stage 3a: Secondary | ICD-10-CM | POA: Diagnosis not present

## 2022-03-22 DIAGNOSIS — F0280 Dementia in other diseases classified elsewhere without behavioral disturbance: Secondary | ICD-10-CM | POA: Diagnosis not present

## 2022-03-22 DIAGNOSIS — I129 Hypertensive chronic kidney disease with stage 1 through stage 4 chronic kidney disease, or unspecified chronic kidney disease: Secondary | ICD-10-CM | POA: Diagnosis not present

## 2022-03-22 DIAGNOSIS — I739 Peripheral vascular disease, unspecified: Secondary | ICD-10-CM | POA: Diagnosis not present

## 2022-03-22 DIAGNOSIS — G311 Senile degeneration of brain, not elsewhere classified: Secondary | ICD-10-CM | POA: Diagnosis not present

## 2022-03-25 DIAGNOSIS — G311 Senile degeneration of brain, not elsewhere classified: Secondary | ICD-10-CM | POA: Diagnosis not present

## 2022-03-25 DIAGNOSIS — E1142 Type 2 diabetes mellitus with diabetic polyneuropathy: Secondary | ICD-10-CM | POA: Diagnosis not present

## 2022-03-25 DIAGNOSIS — N1831 Chronic kidney disease, stage 3a: Secondary | ICD-10-CM | POA: Diagnosis not present

## 2022-03-25 DIAGNOSIS — I739 Peripheral vascular disease, unspecified: Secondary | ICD-10-CM | POA: Diagnosis not present

## 2022-03-25 DIAGNOSIS — I129 Hypertensive chronic kidney disease with stage 1 through stage 4 chronic kidney disease, or unspecified chronic kidney disease: Secondary | ICD-10-CM | POA: Diagnosis not present

## 2022-03-25 DIAGNOSIS — F0280 Dementia in other diseases classified elsewhere without behavioral disturbance: Secondary | ICD-10-CM | POA: Diagnosis not present

## 2022-03-29 DIAGNOSIS — F0280 Dementia in other diseases classified elsewhere without behavioral disturbance: Secondary | ICD-10-CM | POA: Diagnosis not present

## 2022-03-29 DIAGNOSIS — I739 Peripheral vascular disease, unspecified: Secondary | ICD-10-CM | POA: Diagnosis not present

## 2022-03-29 DIAGNOSIS — G311 Senile degeneration of brain, not elsewhere classified: Secondary | ICD-10-CM | POA: Diagnosis not present

## 2022-03-29 DIAGNOSIS — E1142 Type 2 diabetes mellitus with diabetic polyneuropathy: Secondary | ICD-10-CM | POA: Diagnosis not present

## 2022-03-29 DIAGNOSIS — N1831 Chronic kidney disease, stage 3a: Secondary | ICD-10-CM | POA: Diagnosis not present

## 2022-03-29 DIAGNOSIS — I129 Hypertensive chronic kidney disease with stage 1 through stage 4 chronic kidney disease, or unspecified chronic kidney disease: Secondary | ICD-10-CM | POA: Diagnosis not present

## 2022-04-01 DIAGNOSIS — E1142 Type 2 diabetes mellitus with diabetic polyneuropathy: Secondary | ICD-10-CM | POA: Diagnosis not present

## 2022-04-01 DIAGNOSIS — F0280 Dementia in other diseases classified elsewhere without behavioral disturbance: Secondary | ICD-10-CM | POA: Diagnosis not present

## 2022-04-01 DIAGNOSIS — N1831 Chronic kidney disease, stage 3a: Secondary | ICD-10-CM | POA: Diagnosis not present

## 2022-04-01 DIAGNOSIS — I129 Hypertensive chronic kidney disease with stage 1 through stage 4 chronic kidney disease, or unspecified chronic kidney disease: Secondary | ICD-10-CM | POA: Diagnosis not present

## 2022-04-01 DIAGNOSIS — I739 Peripheral vascular disease, unspecified: Secondary | ICD-10-CM | POA: Diagnosis not present

## 2022-04-01 DIAGNOSIS — G311 Senile degeneration of brain, not elsewhere classified: Secondary | ICD-10-CM | POA: Diagnosis not present

## 2022-04-05 DIAGNOSIS — N1831 Chronic kidney disease, stage 3a: Secondary | ICD-10-CM | POA: Diagnosis not present

## 2022-04-05 DIAGNOSIS — G311 Senile degeneration of brain, not elsewhere classified: Secondary | ICD-10-CM | POA: Diagnosis not present

## 2022-04-05 DIAGNOSIS — I129 Hypertensive chronic kidney disease with stage 1 through stage 4 chronic kidney disease, or unspecified chronic kidney disease: Secondary | ICD-10-CM | POA: Diagnosis not present

## 2022-04-05 DIAGNOSIS — E1142 Type 2 diabetes mellitus with diabetic polyneuropathy: Secondary | ICD-10-CM | POA: Diagnosis not present

## 2022-04-05 DIAGNOSIS — I739 Peripheral vascular disease, unspecified: Secondary | ICD-10-CM | POA: Diagnosis not present

## 2022-04-05 DIAGNOSIS — F0280 Dementia in other diseases classified elsewhere without behavioral disturbance: Secondary | ICD-10-CM | POA: Diagnosis not present

## 2022-04-08 DIAGNOSIS — G311 Senile degeneration of brain, not elsewhere classified: Secondary | ICD-10-CM | POA: Diagnosis not present

## 2022-04-08 DIAGNOSIS — I739 Peripheral vascular disease, unspecified: Secondary | ICD-10-CM | POA: Diagnosis not present

## 2022-04-08 DIAGNOSIS — I129 Hypertensive chronic kidney disease with stage 1 through stage 4 chronic kidney disease, or unspecified chronic kidney disease: Secondary | ICD-10-CM | POA: Diagnosis not present

## 2022-04-08 DIAGNOSIS — E1142 Type 2 diabetes mellitus with diabetic polyneuropathy: Secondary | ICD-10-CM | POA: Diagnosis not present

## 2022-04-08 DIAGNOSIS — F0280 Dementia in other diseases classified elsewhere without behavioral disturbance: Secondary | ICD-10-CM | POA: Diagnosis not present

## 2022-04-08 DIAGNOSIS — N1831 Chronic kidney disease, stage 3a: Secondary | ICD-10-CM | POA: Diagnosis not present

## 2022-04-12 DIAGNOSIS — N1831 Chronic kidney disease, stage 3a: Secondary | ICD-10-CM | POA: Diagnosis not present

## 2022-04-12 DIAGNOSIS — I129 Hypertensive chronic kidney disease with stage 1 through stage 4 chronic kidney disease, or unspecified chronic kidney disease: Secondary | ICD-10-CM | POA: Diagnosis not present

## 2022-04-12 DIAGNOSIS — E1142 Type 2 diabetes mellitus with diabetic polyneuropathy: Secondary | ICD-10-CM | POA: Diagnosis not present

## 2022-04-12 DIAGNOSIS — G311 Senile degeneration of brain, not elsewhere classified: Secondary | ICD-10-CM | POA: Diagnosis not present

## 2022-04-12 DIAGNOSIS — F0280 Dementia in other diseases classified elsewhere without behavioral disturbance: Secondary | ICD-10-CM | POA: Diagnosis not present

## 2022-04-12 DIAGNOSIS — I739 Peripheral vascular disease, unspecified: Secondary | ICD-10-CM | POA: Diagnosis not present

## 2022-04-15 DIAGNOSIS — L89152 Pressure ulcer of sacral region, stage 2: Secondary | ICD-10-CM | POA: Diagnosis not present

## 2022-04-15 DIAGNOSIS — F0280 Dementia in other diseases classified elsewhere without behavioral disturbance: Secondary | ICD-10-CM | POA: Diagnosis not present

## 2022-04-15 DIAGNOSIS — E039 Hypothyroidism, unspecified: Secondary | ICD-10-CM | POA: Diagnosis not present

## 2022-04-15 DIAGNOSIS — E1142 Type 2 diabetes mellitus with diabetic polyneuropathy: Secondary | ICD-10-CM | POA: Diagnosis not present

## 2022-04-15 DIAGNOSIS — N1831 Chronic kidney disease, stage 3a: Secondary | ICD-10-CM | POA: Diagnosis not present

## 2022-04-15 DIAGNOSIS — F329 Major depressive disorder, single episode, unspecified: Secondary | ICD-10-CM | POA: Diagnosis not present

## 2022-04-15 DIAGNOSIS — I739 Peripheral vascular disease, unspecified: Secondary | ICD-10-CM | POA: Diagnosis not present

## 2022-04-15 DIAGNOSIS — Z8673 Personal history of transient ischemic attack (TIA), and cerebral infarction without residual deficits: Secondary | ICD-10-CM | POA: Diagnosis not present

## 2022-04-15 DIAGNOSIS — R269 Unspecified abnormalities of gait and mobility: Secondary | ICD-10-CM | POA: Diagnosis not present

## 2022-04-15 DIAGNOSIS — G311 Senile degeneration of brain, not elsewhere classified: Secondary | ICD-10-CM | POA: Diagnosis not present

## 2022-04-15 DIAGNOSIS — E785 Hyperlipidemia, unspecified: Secondary | ICD-10-CM | POA: Diagnosis not present

## 2022-04-15 DIAGNOSIS — I129 Hypertensive chronic kidney disease with stage 1 through stage 4 chronic kidney disease, or unspecified chronic kidney disease: Secondary | ICD-10-CM | POA: Diagnosis not present

## 2022-04-19 DIAGNOSIS — F0280 Dementia in other diseases classified elsewhere without behavioral disturbance: Secondary | ICD-10-CM | POA: Diagnosis not present

## 2022-04-19 DIAGNOSIS — I129 Hypertensive chronic kidney disease with stage 1 through stage 4 chronic kidney disease, or unspecified chronic kidney disease: Secondary | ICD-10-CM | POA: Diagnosis not present

## 2022-04-19 DIAGNOSIS — I739 Peripheral vascular disease, unspecified: Secondary | ICD-10-CM | POA: Diagnosis not present

## 2022-04-19 DIAGNOSIS — G311 Senile degeneration of brain, not elsewhere classified: Secondary | ICD-10-CM | POA: Diagnosis not present

## 2022-04-19 DIAGNOSIS — N1831 Chronic kidney disease, stage 3a: Secondary | ICD-10-CM | POA: Diagnosis not present

## 2022-04-19 DIAGNOSIS — E1142 Type 2 diabetes mellitus with diabetic polyneuropathy: Secondary | ICD-10-CM | POA: Diagnosis not present

## 2022-04-22 DIAGNOSIS — E1142 Type 2 diabetes mellitus with diabetic polyneuropathy: Secondary | ICD-10-CM | POA: Diagnosis not present

## 2022-04-22 DIAGNOSIS — I129 Hypertensive chronic kidney disease with stage 1 through stage 4 chronic kidney disease, or unspecified chronic kidney disease: Secondary | ICD-10-CM | POA: Diagnosis not present

## 2022-04-22 DIAGNOSIS — I739 Peripheral vascular disease, unspecified: Secondary | ICD-10-CM | POA: Diagnosis not present

## 2022-04-22 DIAGNOSIS — G311 Senile degeneration of brain, not elsewhere classified: Secondary | ICD-10-CM | POA: Diagnosis not present

## 2022-04-22 DIAGNOSIS — N1831 Chronic kidney disease, stage 3a: Secondary | ICD-10-CM | POA: Diagnosis not present

## 2022-04-22 DIAGNOSIS — F0280 Dementia in other diseases classified elsewhere without behavioral disturbance: Secondary | ICD-10-CM | POA: Diagnosis not present

## 2022-04-26 DIAGNOSIS — F0280 Dementia in other diseases classified elsewhere without behavioral disturbance: Secondary | ICD-10-CM | POA: Diagnosis not present

## 2022-04-26 DIAGNOSIS — N1831 Chronic kidney disease, stage 3a: Secondary | ICD-10-CM | POA: Diagnosis not present

## 2022-04-26 DIAGNOSIS — G311 Senile degeneration of brain, not elsewhere classified: Secondary | ICD-10-CM | POA: Diagnosis not present

## 2022-04-26 DIAGNOSIS — I739 Peripheral vascular disease, unspecified: Secondary | ICD-10-CM | POA: Diagnosis not present

## 2022-04-26 DIAGNOSIS — I129 Hypertensive chronic kidney disease with stage 1 through stage 4 chronic kidney disease, or unspecified chronic kidney disease: Secondary | ICD-10-CM | POA: Diagnosis not present

## 2022-04-26 DIAGNOSIS — E1142 Type 2 diabetes mellitus with diabetic polyneuropathy: Secondary | ICD-10-CM | POA: Diagnosis not present

## 2022-04-29 DIAGNOSIS — I739 Peripheral vascular disease, unspecified: Secondary | ICD-10-CM | POA: Diagnosis not present

## 2022-04-29 DIAGNOSIS — E1142 Type 2 diabetes mellitus with diabetic polyneuropathy: Secondary | ICD-10-CM | POA: Diagnosis not present

## 2022-04-29 DIAGNOSIS — G311 Senile degeneration of brain, not elsewhere classified: Secondary | ICD-10-CM | POA: Diagnosis not present

## 2022-04-29 DIAGNOSIS — N1831 Chronic kidney disease, stage 3a: Secondary | ICD-10-CM | POA: Diagnosis not present

## 2022-04-29 DIAGNOSIS — F0280 Dementia in other diseases classified elsewhere without behavioral disturbance: Secondary | ICD-10-CM | POA: Diagnosis not present

## 2022-04-29 DIAGNOSIS — I129 Hypertensive chronic kidney disease with stage 1 through stage 4 chronic kidney disease, or unspecified chronic kidney disease: Secondary | ICD-10-CM | POA: Diagnosis not present

## 2022-05-03 DIAGNOSIS — G311 Senile degeneration of brain, not elsewhere classified: Secondary | ICD-10-CM | POA: Diagnosis not present

## 2022-05-03 DIAGNOSIS — F0280 Dementia in other diseases classified elsewhere without behavioral disturbance: Secondary | ICD-10-CM | POA: Diagnosis not present

## 2022-05-03 DIAGNOSIS — I739 Peripheral vascular disease, unspecified: Secondary | ICD-10-CM | POA: Diagnosis not present

## 2022-05-03 DIAGNOSIS — N1831 Chronic kidney disease, stage 3a: Secondary | ICD-10-CM | POA: Diagnosis not present

## 2022-05-03 DIAGNOSIS — I129 Hypertensive chronic kidney disease with stage 1 through stage 4 chronic kidney disease, or unspecified chronic kidney disease: Secondary | ICD-10-CM | POA: Diagnosis not present

## 2022-05-03 DIAGNOSIS — E1142 Type 2 diabetes mellitus with diabetic polyneuropathy: Secondary | ICD-10-CM | POA: Diagnosis not present

## 2022-05-06 DIAGNOSIS — E1142 Type 2 diabetes mellitus with diabetic polyneuropathy: Secondary | ICD-10-CM | POA: Diagnosis not present

## 2022-05-06 DIAGNOSIS — F0280 Dementia in other diseases classified elsewhere without behavioral disturbance: Secondary | ICD-10-CM | POA: Diagnosis not present

## 2022-05-06 DIAGNOSIS — I739 Peripheral vascular disease, unspecified: Secondary | ICD-10-CM | POA: Diagnosis not present

## 2022-05-06 DIAGNOSIS — N1831 Chronic kidney disease, stage 3a: Secondary | ICD-10-CM | POA: Diagnosis not present

## 2022-05-06 DIAGNOSIS — I129 Hypertensive chronic kidney disease with stage 1 through stage 4 chronic kidney disease, or unspecified chronic kidney disease: Secondary | ICD-10-CM | POA: Diagnosis not present

## 2022-05-06 DIAGNOSIS — G311 Senile degeneration of brain, not elsewhere classified: Secondary | ICD-10-CM | POA: Diagnosis not present

## 2022-05-10 DIAGNOSIS — E1142 Type 2 diabetes mellitus with diabetic polyneuropathy: Secondary | ICD-10-CM | POA: Diagnosis not present

## 2022-05-10 DIAGNOSIS — I739 Peripheral vascular disease, unspecified: Secondary | ICD-10-CM | POA: Diagnosis not present

## 2022-05-10 DIAGNOSIS — N1831 Chronic kidney disease, stage 3a: Secondary | ICD-10-CM | POA: Diagnosis not present

## 2022-05-10 DIAGNOSIS — I129 Hypertensive chronic kidney disease with stage 1 through stage 4 chronic kidney disease, or unspecified chronic kidney disease: Secondary | ICD-10-CM | POA: Diagnosis not present

## 2022-05-10 DIAGNOSIS — G311 Senile degeneration of brain, not elsewhere classified: Secondary | ICD-10-CM | POA: Diagnosis not present

## 2022-05-10 DIAGNOSIS — F0280 Dementia in other diseases classified elsewhere without behavioral disturbance: Secondary | ICD-10-CM | POA: Diagnosis not present

## 2022-05-16 DIAGNOSIS — R269 Unspecified abnormalities of gait and mobility: Secondary | ICD-10-CM | POA: Diagnosis not present

## 2022-05-16 DIAGNOSIS — I129 Hypertensive chronic kidney disease with stage 1 through stage 4 chronic kidney disease, or unspecified chronic kidney disease: Secondary | ICD-10-CM | POA: Diagnosis not present

## 2022-05-16 DIAGNOSIS — E039 Hypothyroidism, unspecified: Secondary | ICD-10-CM | POA: Diagnosis not present

## 2022-05-16 DIAGNOSIS — F0280 Dementia in other diseases classified elsewhere without behavioral disturbance: Secondary | ICD-10-CM | POA: Diagnosis not present

## 2022-05-16 DIAGNOSIS — F329 Major depressive disorder, single episode, unspecified: Secondary | ICD-10-CM | POA: Diagnosis not present

## 2022-05-16 DIAGNOSIS — I739 Peripheral vascular disease, unspecified: Secondary | ICD-10-CM | POA: Diagnosis not present

## 2022-05-16 DIAGNOSIS — E1142 Type 2 diabetes mellitus with diabetic polyneuropathy: Secondary | ICD-10-CM | POA: Diagnosis not present

## 2022-05-16 DIAGNOSIS — L89152 Pressure ulcer of sacral region, stage 2: Secondary | ICD-10-CM | POA: Diagnosis not present

## 2022-05-16 DIAGNOSIS — Z8673 Personal history of transient ischemic attack (TIA), and cerebral infarction without residual deficits: Secondary | ICD-10-CM | POA: Diagnosis not present

## 2022-05-16 DIAGNOSIS — G311 Senile degeneration of brain, not elsewhere classified: Secondary | ICD-10-CM | POA: Diagnosis not present

## 2022-05-16 DIAGNOSIS — N1831 Chronic kidney disease, stage 3a: Secondary | ICD-10-CM | POA: Diagnosis not present

## 2022-05-16 DIAGNOSIS — E785 Hyperlipidemia, unspecified: Secondary | ICD-10-CM | POA: Diagnosis not present

## 2022-05-17 DIAGNOSIS — E1142 Type 2 diabetes mellitus with diabetic polyneuropathy: Secondary | ICD-10-CM | POA: Diagnosis not present

## 2022-05-17 DIAGNOSIS — G311 Senile degeneration of brain, not elsewhere classified: Secondary | ICD-10-CM | POA: Diagnosis not present

## 2022-05-17 DIAGNOSIS — F0280 Dementia in other diseases classified elsewhere without behavioral disturbance: Secondary | ICD-10-CM | POA: Diagnosis not present

## 2022-05-17 DIAGNOSIS — I129 Hypertensive chronic kidney disease with stage 1 through stage 4 chronic kidney disease, or unspecified chronic kidney disease: Secondary | ICD-10-CM | POA: Diagnosis not present

## 2022-05-17 DIAGNOSIS — N1831 Chronic kidney disease, stage 3a: Secondary | ICD-10-CM | POA: Diagnosis not present

## 2022-05-17 DIAGNOSIS — I739 Peripheral vascular disease, unspecified: Secondary | ICD-10-CM | POA: Diagnosis not present

## 2022-05-20 DIAGNOSIS — E1142 Type 2 diabetes mellitus with diabetic polyneuropathy: Secondary | ICD-10-CM | POA: Diagnosis not present

## 2022-05-20 DIAGNOSIS — G311 Senile degeneration of brain, not elsewhere classified: Secondary | ICD-10-CM | POA: Diagnosis not present

## 2022-05-20 DIAGNOSIS — I129 Hypertensive chronic kidney disease with stage 1 through stage 4 chronic kidney disease, or unspecified chronic kidney disease: Secondary | ICD-10-CM | POA: Diagnosis not present

## 2022-05-20 DIAGNOSIS — N1831 Chronic kidney disease, stage 3a: Secondary | ICD-10-CM | POA: Diagnosis not present

## 2022-05-20 DIAGNOSIS — I739 Peripheral vascular disease, unspecified: Secondary | ICD-10-CM | POA: Diagnosis not present

## 2022-05-20 DIAGNOSIS — F0280 Dementia in other diseases classified elsewhere without behavioral disturbance: Secondary | ICD-10-CM | POA: Diagnosis not present

## 2022-05-24 DIAGNOSIS — F0280 Dementia in other diseases classified elsewhere without behavioral disturbance: Secondary | ICD-10-CM | POA: Diagnosis not present

## 2022-05-24 DIAGNOSIS — I129 Hypertensive chronic kidney disease with stage 1 through stage 4 chronic kidney disease, or unspecified chronic kidney disease: Secondary | ICD-10-CM | POA: Diagnosis not present

## 2022-05-24 DIAGNOSIS — G311 Senile degeneration of brain, not elsewhere classified: Secondary | ICD-10-CM | POA: Diagnosis not present

## 2022-05-24 DIAGNOSIS — E1142 Type 2 diabetes mellitus with diabetic polyneuropathy: Secondary | ICD-10-CM | POA: Diagnosis not present

## 2022-05-24 DIAGNOSIS — N1831 Chronic kidney disease, stage 3a: Secondary | ICD-10-CM | POA: Diagnosis not present

## 2022-05-24 DIAGNOSIS — I739 Peripheral vascular disease, unspecified: Secondary | ICD-10-CM | POA: Diagnosis not present

## 2022-05-27 DIAGNOSIS — G311 Senile degeneration of brain, not elsewhere classified: Secondary | ICD-10-CM | POA: Diagnosis not present

## 2022-05-27 DIAGNOSIS — I129 Hypertensive chronic kidney disease with stage 1 through stage 4 chronic kidney disease, or unspecified chronic kidney disease: Secondary | ICD-10-CM | POA: Diagnosis not present

## 2022-05-27 DIAGNOSIS — N1831 Chronic kidney disease, stage 3a: Secondary | ICD-10-CM | POA: Diagnosis not present

## 2022-05-27 DIAGNOSIS — E1142 Type 2 diabetes mellitus with diabetic polyneuropathy: Secondary | ICD-10-CM | POA: Diagnosis not present

## 2022-05-27 DIAGNOSIS — I739 Peripheral vascular disease, unspecified: Secondary | ICD-10-CM | POA: Diagnosis not present

## 2022-05-27 DIAGNOSIS — F0280 Dementia in other diseases classified elsewhere without behavioral disturbance: Secondary | ICD-10-CM | POA: Diagnosis not present

## 2022-05-31 DIAGNOSIS — I739 Peripheral vascular disease, unspecified: Secondary | ICD-10-CM | POA: Diagnosis not present

## 2022-05-31 DIAGNOSIS — N1831 Chronic kidney disease, stage 3a: Secondary | ICD-10-CM | POA: Diagnosis not present

## 2022-05-31 DIAGNOSIS — G311 Senile degeneration of brain, not elsewhere classified: Secondary | ICD-10-CM | POA: Diagnosis not present

## 2022-05-31 DIAGNOSIS — E1142 Type 2 diabetes mellitus with diabetic polyneuropathy: Secondary | ICD-10-CM | POA: Diagnosis not present

## 2022-05-31 DIAGNOSIS — I129 Hypertensive chronic kidney disease with stage 1 through stage 4 chronic kidney disease, or unspecified chronic kidney disease: Secondary | ICD-10-CM | POA: Diagnosis not present

## 2022-05-31 DIAGNOSIS — F0280 Dementia in other diseases classified elsewhere without behavioral disturbance: Secondary | ICD-10-CM | POA: Diagnosis not present

## 2022-06-03 DIAGNOSIS — I129 Hypertensive chronic kidney disease with stage 1 through stage 4 chronic kidney disease, or unspecified chronic kidney disease: Secondary | ICD-10-CM | POA: Diagnosis not present

## 2022-06-03 DIAGNOSIS — F0280 Dementia in other diseases classified elsewhere without behavioral disturbance: Secondary | ICD-10-CM | POA: Diagnosis not present

## 2022-06-03 DIAGNOSIS — G311 Senile degeneration of brain, not elsewhere classified: Secondary | ICD-10-CM | POA: Diagnosis not present

## 2022-06-03 DIAGNOSIS — N1831 Chronic kidney disease, stage 3a: Secondary | ICD-10-CM | POA: Diagnosis not present

## 2022-06-03 DIAGNOSIS — I739 Peripheral vascular disease, unspecified: Secondary | ICD-10-CM | POA: Diagnosis not present

## 2022-06-03 DIAGNOSIS — E1142 Type 2 diabetes mellitus with diabetic polyneuropathy: Secondary | ICD-10-CM | POA: Diagnosis not present

## 2022-06-07 DIAGNOSIS — I129 Hypertensive chronic kidney disease with stage 1 through stage 4 chronic kidney disease, or unspecified chronic kidney disease: Secondary | ICD-10-CM | POA: Diagnosis not present

## 2022-06-07 DIAGNOSIS — G311 Senile degeneration of brain, not elsewhere classified: Secondary | ICD-10-CM | POA: Diagnosis not present

## 2022-06-07 DIAGNOSIS — E1142 Type 2 diabetes mellitus with diabetic polyneuropathy: Secondary | ICD-10-CM | POA: Diagnosis not present

## 2022-06-07 DIAGNOSIS — N1831 Chronic kidney disease, stage 3a: Secondary | ICD-10-CM | POA: Diagnosis not present

## 2022-06-07 DIAGNOSIS — F0280 Dementia in other diseases classified elsewhere without behavioral disturbance: Secondary | ICD-10-CM | POA: Diagnosis not present

## 2022-06-07 DIAGNOSIS — I739 Peripheral vascular disease, unspecified: Secondary | ICD-10-CM | POA: Diagnosis not present

## 2022-06-14 DIAGNOSIS — E1142 Type 2 diabetes mellitus with diabetic polyneuropathy: Secondary | ICD-10-CM | POA: Diagnosis not present

## 2022-06-14 DIAGNOSIS — I739 Peripheral vascular disease, unspecified: Secondary | ICD-10-CM | POA: Diagnosis not present

## 2022-06-14 DIAGNOSIS — I129 Hypertensive chronic kidney disease with stage 1 through stage 4 chronic kidney disease, or unspecified chronic kidney disease: Secondary | ICD-10-CM | POA: Diagnosis not present

## 2022-06-14 DIAGNOSIS — F0280 Dementia in other diseases classified elsewhere without behavioral disturbance: Secondary | ICD-10-CM | POA: Diagnosis not present

## 2022-06-14 DIAGNOSIS — N1831 Chronic kidney disease, stage 3a: Secondary | ICD-10-CM | POA: Diagnosis not present

## 2022-06-14 DIAGNOSIS — G311 Senile degeneration of brain, not elsewhere classified: Secondary | ICD-10-CM | POA: Diagnosis not present

## 2022-06-15 DIAGNOSIS — E039 Hypothyroidism, unspecified: Secondary | ICD-10-CM | POA: Diagnosis not present

## 2022-06-15 DIAGNOSIS — G311 Senile degeneration of brain, not elsewhere classified: Secondary | ICD-10-CM | POA: Diagnosis not present

## 2022-06-15 DIAGNOSIS — F0280 Dementia in other diseases classified elsewhere without behavioral disturbance: Secondary | ICD-10-CM | POA: Diagnosis not present

## 2022-06-15 DIAGNOSIS — E1142 Type 2 diabetes mellitus with diabetic polyneuropathy: Secondary | ICD-10-CM | POA: Diagnosis not present

## 2022-06-15 DIAGNOSIS — F329 Major depressive disorder, single episode, unspecified: Secondary | ICD-10-CM | POA: Diagnosis not present

## 2022-06-15 DIAGNOSIS — E785 Hyperlipidemia, unspecified: Secondary | ICD-10-CM | POA: Diagnosis not present

## 2022-06-15 DIAGNOSIS — L89152 Pressure ulcer of sacral region, stage 2: Secondary | ICD-10-CM | POA: Diagnosis not present

## 2022-06-15 DIAGNOSIS — R269 Unspecified abnormalities of gait and mobility: Secondary | ICD-10-CM | POA: Diagnosis not present

## 2022-06-15 DIAGNOSIS — I739 Peripheral vascular disease, unspecified: Secondary | ICD-10-CM | POA: Diagnosis not present

## 2022-06-15 DIAGNOSIS — N1831 Chronic kidney disease, stage 3a: Secondary | ICD-10-CM | POA: Diagnosis not present

## 2022-06-15 DIAGNOSIS — Z8673 Personal history of transient ischemic attack (TIA), and cerebral infarction without residual deficits: Secondary | ICD-10-CM | POA: Diagnosis not present

## 2022-06-15 DIAGNOSIS — I129 Hypertensive chronic kidney disease with stage 1 through stage 4 chronic kidney disease, or unspecified chronic kidney disease: Secondary | ICD-10-CM | POA: Diagnosis not present

## 2022-06-16 DIAGNOSIS — I129 Hypertensive chronic kidney disease with stage 1 through stage 4 chronic kidney disease, or unspecified chronic kidney disease: Secondary | ICD-10-CM | POA: Diagnosis not present

## 2022-06-16 DIAGNOSIS — E1142 Type 2 diabetes mellitus with diabetic polyneuropathy: Secondary | ICD-10-CM | POA: Diagnosis not present

## 2022-06-16 DIAGNOSIS — I739 Peripheral vascular disease, unspecified: Secondary | ICD-10-CM | POA: Diagnosis not present

## 2022-06-16 DIAGNOSIS — G311 Senile degeneration of brain, not elsewhere classified: Secondary | ICD-10-CM | POA: Diagnosis not present

## 2022-06-16 DIAGNOSIS — F0280 Dementia in other diseases classified elsewhere without behavioral disturbance: Secondary | ICD-10-CM | POA: Diagnosis not present

## 2022-06-16 DIAGNOSIS — N1831 Chronic kidney disease, stage 3a: Secondary | ICD-10-CM | POA: Diagnosis not present

## 2022-06-21 DIAGNOSIS — F0280 Dementia in other diseases classified elsewhere without behavioral disturbance: Secondary | ICD-10-CM | POA: Diagnosis not present

## 2022-06-21 DIAGNOSIS — E1142 Type 2 diabetes mellitus with diabetic polyneuropathy: Secondary | ICD-10-CM | POA: Diagnosis not present

## 2022-06-21 DIAGNOSIS — I739 Peripheral vascular disease, unspecified: Secondary | ICD-10-CM | POA: Diagnosis not present

## 2022-06-21 DIAGNOSIS — G311 Senile degeneration of brain, not elsewhere classified: Secondary | ICD-10-CM | POA: Diagnosis not present

## 2022-06-21 DIAGNOSIS — N1831 Chronic kidney disease, stage 3a: Secondary | ICD-10-CM | POA: Diagnosis not present

## 2022-06-21 DIAGNOSIS — I129 Hypertensive chronic kidney disease with stage 1 through stage 4 chronic kidney disease, or unspecified chronic kidney disease: Secondary | ICD-10-CM | POA: Diagnosis not present

## 2022-06-23 DIAGNOSIS — I129 Hypertensive chronic kidney disease with stage 1 through stage 4 chronic kidney disease, or unspecified chronic kidney disease: Secondary | ICD-10-CM | POA: Diagnosis not present

## 2022-06-23 DIAGNOSIS — N1831 Chronic kidney disease, stage 3a: Secondary | ICD-10-CM | POA: Diagnosis not present

## 2022-06-23 DIAGNOSIS — E1142 Type 2 diabetes mellitus with diabetic polyneuropathy: Secondary | ICD-10-CM | POA: Diagnosis not present

## 2022-06-23 DIAGNOSIS — F0280 Dementia in other diseases classified elsewhere without behavioral disturbance: Secondary | ICD-10-CM | POA: Diagnosis not present

## 2022-06-23 DIAGNOSIS — I739 Peripheral vascular disease, unspecified: Secondary | ICD-10-CM | POA: Diagnosis not present

## 2022-06-23 DIAGNOSIS — G311 Senile degeneration of brain, not elsewhere classified: Secondary | ICD-10-CM | POA: Diagnosis not present

## 2022-06-28 DIAGNOSIS — I739 Peripheral vascular disease, unspecified: Secondary | ICD-10-CM | POA: Diagnosis not present

## 2022-06-28 DIAGNOSIS — N1831 Chronic kidney disease, stage 3a: Secondary | ICD-10-CM | POA: Diagnosis not present

## 2022-06-28 DIAGNOSIS — G311 Senile degeneration of brain, not elsewhere classified: Secondary | ICD-10-CM | POA: Diagnosis not present

## 2022-06-28 DIAGNOSIS — E1142 Type 2 diabetes mellitus with diabetic polyneuropathy: Secondary | ICD-10-CM | POA: Diagnosis not present

## 2022-06-28 DIAGNOSIS — I129 Hypertensive chronic kidney disease with stage 1 through stage 4 chronic kidney disease, or unspecified chronic kidney disease: Secondary | ICD-10-CM | POA: Diagnosis not present

## 2022-06-28 DIAGNOSIS — F0280 Dementia in other diseases classified elsewhere without behavioral disturbance: Secondary | ICD-10-CM | POA: Diagnosis not present

## 2022-06-30 DIAGNOSIS — N1831 Chronic kidney disease, stage 3a: Secondary | ICD-10-CM | POA: Diagnosis not present

## 2022-06-30 DIAGNOSIS — I129 Hypertensive chronic kidney disease with stage 1 through stage 4 chronic kidney disease, or unspecified chronic kidney disease: Secondary | ICD-10-CM | POA: Diagnosis not present

## 2022-06-30 DIAGNOSIS — F0280 Dementia in other diseases classified elsewhere without behavioral disturbance: Secondary | ICD-10-CM | POA: Diagnosis not present

## 2022-06-30 DIAGNOSIS — E1142 Type 2 diabetes mellitus with diabetic polyneuropathy: Secondary | ICD-10-CM | POA: Diagnosis not present

## 2022-06-30 DIAGNOSIS — I739 Peripheral vascular disease, unspecified: Secondary | ICD-10-CM | POA: Diagnosis not present

## 2022-06-30 DIAGNOSIS — G311 Senile degeneration of brain, not elsewhere classified: Secondary | ICD-10-CM | POA: Diagnosis not present

## 2022-07-05 DIAGNOSIS — F0280 Dementia in other diseases classified elsewhere without behavioral disturbance: Secondary | ICD-10-CM | POA: Diagnosis not present

## 2022-07-05 DIAGNOSIS — N1831 Chronic kidney disease, stage 3a: Secondary | ICD-10-CM | POA: Diagnosis not present

## 2022-07-05 DIAGNOSIS — I739 Peripheral vascular disease, unspecified: Secondary | ICD-10-CM | POA: Diagnosis not present

## 2022-07-05 DIAGNOSIS — G311 Senile degeneration of brain, not elsewhere classified: Secondary | ICD-10-CM | POA: Diagnosis not present

## 2022-07-05 DIAGNOSIS — E1142 Type 2 diabetes mellitus with diabetic polyneuropathy: Secondary | ICD-10-CM | POA: Diagnosis not present

## 2022-07-05 DIAGNOSIS — I129 Hypertensive chronic kidney disease with stage 1 through stage 4 chronic kidney disease, or unspecified chronic kidney disease: Secondary | ICD-10-CM | POA: Diagnosis not present

## 2022-07-07 DIAGNOSIS — N1831 Chronic kidney disease, stage 3a: Secondary | ICD-10-CM | POA: Diagnosis not present

## 2022-07-07 DIAGNOSIS — G311 Senile degeneration of brain, not elsewhere classified: Secondary | ICD-10-CM | POA: Diagnosis not present

## 2022-07-07 DIAGNOSIS — I129 Hypertensive chronic kidney disease with stage 1 through stage 4 chronic kidney disease, or unspecified chronic kidney disease: Secondary | ICD-10-CM | POA: Diagnosis not present

## 2022-07-07 DIAGNOSIS — I739 Peripheral vascular disease, unspecified: Secondary | ICD-10-CM | POA: Diagnosis not present

## 2022-07-07 DIAGNOSIS — F0280 Dementia in other diseases classified elsewhere without behavioral disturbance: Secondary | ICD-10-CM | POA: Diagnosis not present

## 2022-07-07 DIAGNOSIS — E1142 Type 2 diabetes mellitus with diabetic polyneuropathy: Secondary | ICD-10-CM | POA: Diagnosis not present

## 2022-07-12 DIAGNOSIS — I129 Hypertensive chronic kidney disease with stage 1 through stage 4 chronic kidney disease, or unspecified chronic kidney disease: Secondary | ICD-10-CM | POA: Diagnosis not present

## 2022-07-12 DIAGNOSIS — G311 Senile degeneration of brain, not elsewhere classified: Secondary | ICD-10-CM | POA: Diagnosis not present

## 2022-07-12 DIAGNOSIS — I739 Peripheral vascular disease, unspecified: Secondary | ICD-10-CM | POA: Diagnosis not present

## 2022-07-12 DIAGNOSIS — E1142 Type 2 diabetes mellitus with diabetic polyneuropathy: Secondary | ICD-10-CM | POA: Diagnosis not present

## 2022-07-12 DIAGNOSIS — F0280 Dementia in other diseases classified elsewhere without behavioral disturbance: Secondary | ICD-10-CM | POA: Diagnosis not present

## 2022-07-12 DIAGNOSIS — N1831 Chronic kidney disease, stage 3a: Secondary | ICD-10-CM | POA: Diagnosis not present

## 2022-07-15 DIAGNOSIS — I129 Hypertensive chronic kidney disease with stage 1 through stage 4 chronic kidney disease, or unspecified chronic kidney disease: Secondary | ICD-10-CM | POA: Diagnosis not present

## 2022-07-15 DIAGNOSIS — E1142 Type 2 diabetes mellitus with diabetic polyneuropathy: Secondary | ICD-10-CM | POA: Diagnosis not present

## 2022-07-15 DIAGNOSIS — I739 Peripheral vascular disease, unspecified: Secondary | ICD-10-CM | POA: Diagnosis not present

## 2022-07-15 DIAGNOSIS — N1831 Chronic kidney disease, stage 3a: Secondary | ICD-10-CM | POA: Diagnosis not present

## 2022-07-15 DIAGNOSIS — G311 Senile degeneration of brain, not elsewhere classified: Secondary | ICD-10-CM | POA: Diagnosis not present

## 2022-07-15 DIAGNOSIS — F0280 Dementia in other diseases classified elsewhere without behavioral disturbance: Secondary | ICD-10-CM | POA: Diagnosis not present

## 2022-07-16 DIAGNOSIS — I129 Hypertensive chronic kidney disease with stage 1 through stage 4 chronic kidney disease, or unspecified chronic kidney disease: Secondary | ICD-10-CM | POA: Diagnosis not present

## 2022-07-16 DIAGNOSIS — L89152 Pressure ulcer of sacral region, stage 2: Secondary | ICD-10-CM | POA: Diagnosis not present

## 2022-07-16 DIAGNOSIS — E039 Hypothyroidism, unspecified: Secondary | ICD-10-CM | POA: Diagnosis not present

## 2022-07-16 DIAGNOSIS — F0280 Dementia in other diseases classified elsewhere without behavioral disturbance: Secondary | ICD-10-CM | POA: Diagnosis not present

## 2022-07-16 DIAGNOSIS — N1831 Chronic kidney disease, stage 3a: Secondary | ICD-10-CM | POA: Diagnosis not present

## 2022-07-16 DIAGNOSIS — Z8673 Personal history of transient ischemic attack (TIA), and cerebral infarction without residual deficits: Secondary | ICD-10-CM | POA: Diagnosis not present

## 2022-07-16 DIAGNOSIS — E785 Hyperlipidemia, unspecified: Secondary | ICD-10-CM | POA: Diagnosis not present

## 2022-07-16 DIAGNOSIS — R269 Unspecified abnormalities of gait and mobility: Secondary | ICD-10-CM | POA: Diagnosis not present

## 2022-07-16 DIAGNOSIS — I739 Peripheral vascular disease, unspecified: Secondary | ICD-10-CM | POA: Diagnosis not present

## 2022-07-16 DIAGNOSIS — G311 Senile degeneration of brain, not elsewhere classified: Secondary | ICD-10-CM | POA: Diagnosis not present

## 2022-07-16 DIAGNOSIS — F329 Major depressive disorder, single episode, unspecified: Secondary | ICD-10-CM | POA: Diagnosis not present

## 2022-07-16 DIAGNOSIS — E1142 Type 2 diabetes mellitus with diabetic polyneuropathy: Secondary | ICD-10-CM | POA: Diagnosis not present

## 2022-07-19 DIAGNOSIS — F0280 Dementia in other diseases classified elsewhere without behavioral disturbance: Secondary | ICD-10-CM | POA: Diagnosis not present

## 2022-07-19 DIAGNOSIS — G311 Senile degeneration of brain, not elsewhere classified: Secondary | ICD-10-CM | POA: Diagnosis not present

## 2022-07-19 DIAGNOSIS — I739 Peripheral vascular disease, unspecified: Secondary | ICD-10-CM | POA: Diagnosis not present

## 2022-07-19 DIAGNOSIS — I129 Hypertensive chronic kidney disease with stage 1 through stage 4 chronic kidney disease, or unspecified chronic kidney disease: Secondary | ICD-10-CM | POA: Diagnosis not present

## 2022-07-19 DIAGNOSIS — E1142 Type 2 diabetes mellitus with diabetic polyneuropathy: Secondary | ICD-10-CM | POA: Diagnosis not present

## 2022-07-19 DIAGNOSIS — N1831 Chronic kidney disease, stage 3a: Secondary | ICD-10-CM | POA: Diagnosis not present

## 2022-07-21 DIAGNOSIS — I739 Peripheral vascular disease, unspecified: Secondary | ICD-10-CM | POA: Diagnosis not present

## 2022-07-21 DIAGNOSIS — N1831 Chronic kidney disease, stage 3a: Secondary | ICD-10-CM | POA: Diagnosis not present

## 2022-07-21 DIAGNOSIS — G311 Senile degeneration of brain, not elsewhere classified: Secondary | ICD-10-CM | POA: Diagnosis not present

## 2022-07-21 DIAGNOSIS — I129 Hypertensive chronic kidney disease with stage 1 through stage 4 chronic kidney disease, or unspecified chronic kidney disease: Secondary | ICD-10-CM | POA: Diagnosis not present

## 2022-07-21 DIAGNOSIS — E1142 Type 2 diabetes mellitus with diabetic polyneuropathy: Secondary | ICD-10-CM | POA: Diagnosis not present

## 2022-07-21 DIAGNOSIS — F0280 Dementia in other diseases classified elsewhere without behavioral disturbance: Secondary | ICD-10-CM | POA: Diagnosis not present

## 2022-07-26 DIAGNOSIS — F0280 Dementia in other diseases classified elsewhere without behavioral disturbance: Secondary | ICD-10-CM | POA: Diagnosis not present

## 2022-07-26 DIAGNOSIS — N1831 Chronic kidney disease, stage 3a: Secondary | ICD-10-CM | POA: Diagnosis not present

## 2022-07-26 DIAGNOSIS — I129 Hypertensive chronic kidney disease with stage 1 through stage 4 chronic kidney disease, or unspecified chronic kidney disease: Secondary | ICD-10-CM | POA: Diagnosis not present

## 2022-07-26 DIAGNOSIS — I739 Peripheral vascular disease, unspecified: Secondary | ICD-10-CM | POA: Diagnosis not present

## 2022-07-26 DIAGNOSIS — G311 Senile degeneration of brain, not elsewhere classified: Secondary | ICD-10-CM | POA: Diagnosis not present

## 2022-07-26 DIAGNOSIS — E1142 Type 2 diabetes mellitus with diabetic polyneuropathy: Secondary | ICD-10-CM | POA: Diagnosis not present

## 2022-07-28 DIAGNOSIS — F0280 Dementia in other diseases classified elsewhere without behavioral disturbance: Secondary | ICD-10-CM | POA: Diagnosis not present

## 2022-07-28 DIAGNOSIS — I129 Hypertensive chronic kidney disease with stage 1 through stage 4 chronic kidney disease, or unspecified chronic kidney disease: Secondary | ICD-10-CM | POA: Diagnosis not present

## 2022-07-28 DIAGNOSIS — I739 Peripheral vascular disease, unspecified: Secondary | ICD-10-CM | POA: Diagnosis not present

## 2022-07-28 DIAGNOSIS — E1142 Type 2 diabetes mellitus with diabetic polyneuropathy: Secondary | ICD-10-CM | POA: Diagnosis not present

## 2022-07-28 DIAGNOSIS — G311 Senile degeneration of brain, not elsewhere classified: Secondary | ICD-10-CM | POA: Diagnosis not present

## 2022-07-28 DIAGNOSIS — N1831 Chronic kidney disease, stage 3a: Secondary | ICD-10-CM | POA: Diagnosis not present

## 2022-08-02 DIAGNOSIS — F0280 Dementia in other diseases classified elsewhere without behavioral disturbance: Secondary | ICD-10-CM | POA: Diagnosis not present

## 2022-08-02 DIAGNOSIS — N1831 Chronic kidney disease, stage 3a: Secondary | ICD-10-CM | POA: Diagnosis not present

## 2022-08-02 DIAGNOSIS — I129 Hypertensive chronic kidney disease with stage 1 through stage 4 chronic kidney disease, or unspecified chronic kidney disease: Secondary | ICD-10-CM | POA: Diagnosis not present

## 2022-08-02 DIAGNOSIS — G311 Senile degeneration of brain, not elsewhere classified: Secondary | ICD-10-CM | POA: Diagnosis not present

## 2022-08-02 DIAGNOSIS — I739 Peripheral vascular disease, unspecified: Secondary | ICD-10-CM | POA: Diagnosis not present

## 2022-08-02 DIAGNOSIS — E1142 Type 2 diabetes mellitus with diabetic polyneuropathy: Secondary | ICD-10-CM | POA: Diagnosis not present

## 2022-08-04 DIAGNOSIS — F0280 Dementia in other diseases classified elsewhere without behavioral disturbance: Secondary | ICD-10-CM | POA: Diagnosis not present

## 2022-08-04 DIAGNOSIS — E1142 Type 2 diabetes mellitus with diabetic polyneuropathy: Secondary | ICD-10-CM | POA: Diagnosis not present

## 2022-08-04 DIAGNOSIS — I129 Hypertensive chronic kidney disease with stage 1 through stage 4 chronic kidney disease, or unspecified chronic kidney disease: Secondary | ICD-10-CM | POA: Diagnosis not present

## 2022-08-04 DIAGNOSIS — I739 Peripheral vascular disease, unspecified: Secondary | ICD-10-CM | POA: Diagnosis not present

## 2022-08-04 DIAGNOSIS — G311 Senile degeneration of brain, not elsewhere classified: Secondary | ICD-10-CM | POA: Diagnosis not present

## 2022-08-04 DIAGNOSIS — N1831 Chronic kidney disease, stage 3a: Secondary | ICD-10-CM | POA: Diagnosis not present

## 2022-08-09 DIAGNOSIS — I739 Peripheral vascular disease, unspecified: Secondary | ICD-10-CM | POA: Diagnosis not present

## 2022-08-09 DIAGNOSIS — I129 Hypertensive chronic kidney disease with stage 1 through stage 4 chronic kidney disease, or unspecified chronic kidney disease: Secondary | ICD-10-CM | POA: Diagnosis not present

## 2022-08-09 DIAGNOSIS — F0280 Dementia in other diseases classified elsewhere without behavioral disturbance: Secondary | ICD-10-CM | POA: Diagnosis not present

## 2022-08-09 DIAGNOSIS — N1831 Chronic kidney disease, stage 3a: Secondary | ICD-10-CM | POA: Diagnosis not present

## 2022-08-09 DIAGNOSIS — E1142 Type 2 diabetes mellitus with diabetic polyneuropathy: Secondary | ICD-10-CM | POA: Diagnosis not present

## 2022-08-09 DIAGNOSIS — G311 Senile degeneration of brain, not elsewhere classified: Secondary | ICD-10-CM | POA: Diagnosis not present

## 2022-08-11 DIAGNOSIS — I739 Peripheral vascular disease, unspecified: Secondary | ICD-10-CM | POA: Diagnosis not present

## 2022-08-11 DIAGNOSIS — N1831 Chronic kidney disease, stage 3a: Secondary | ICD-10-CM | POA: Diagnosis not present

## 2022-08-11 DIAGNOSIS — I129 Hypertensive chronic kidney disease with stage 1 through stage 4 chronic kidney disease, or unspecified chronic kidney disease: Secondary | ICD-10-CM | POA: Diagnosis not present

## 2022-08-11 DIAGNOSIS — F0280 Dementia in other diseases classified elsewhere without behavioral disturbance: Secondary | ICD-10-CM | POA: Diagnosis not present

## 2022-08-11 DIAGNOSIS — E1142 Type 2 diabetes mellitus with diabetic polyneuropathy: Secondary | ICD-10-CM | POA: Diagnosis not present

## 2022-08-11 DIAGNOSIS — G311 Senile degeneration of brain, not elsewhere classified: Secondary | ICD-10-CM | POA: Diagnosis not present

## 2022-08-15 DIAGNOSIS — I129 Hypertensive chronic kidney disease with stage 1 through stage 4 chronic kidney disease, or unspecified chronic kidney disease: Secondary | ICD-10-CM | POA: Diagnosis not present

## 2022-08-15 DIAGNOSIS — F0280 Dementia in other diseases classified elsewhere without behavioral disturbance: Secondary | ICD-10-CM | POA: Diagnosis not present

## 2022-08-15 DIAGNOSIS — F329 Major depressive disorder, single episode, unspecified: Secondary | ICD-10-CM | POA: Diagnosis not present

## 2022-08-15 DIAGNOSIS — I739 Peripheral vascular disease, unspecified: Secondary | ICD-10-CM | POA: Diagnosis not present

## 2022-08-15 DIAGNOSIS — L89152 Pressure ulcer of sacral region, stage 2: Secondary | ICD-10-CM | POA: Diagnosis not present

## 2022-08-15 DIAGNOSIS — R269 Unspecified abnormalities of gait and mobility: Secondary | ICD-10-CM | POA: Diagnosis not present

## 2022-08-15 DIAGNOSIS — E1142 Type 2 diabetes mellitus with diabetic polyneuropathy: Secondary | ICD-10-CM | POA: Diagnosis not present

## 2022-08-15 DIAGNOSIS — G311 Senile degeneration of brain, not elsewhere classified: Secondary | ICD-10-CM | POA: Diagnosis not present

## 2022-08-15 DIAGNOSIS — E039 Hypothyroidism, unspecified: Secondary | ICD-10-CM | POA: Diagnosis not present

## 2022-08-15 DIAGNOSIS — N1831 Chronic kidney disease, stage 3a: Secondary | ICD-10-CM | POA: Diagnosis not present

## 2022-08-15 DIAGNOSIS — Z8673 Personal history of transient ischemic attack (TIA), and cerebral infarction without residual deficits: Secondary | ICD-10-CM | POA: Diagnosis not present

## 2022-08-15 DIAGNOSIS — E785 Hyperlipidemia, unspecified: Secondary | ICD-10-CM | POA: Diagnosis not present

## 2022-08-16 DIAGNOSIS — I129 Hypertensive chronic kidney disease with stage 1 through stage 4 chronic kidney disease, or unspecified chronic kidney disease: Secondary | ICD-10-CM | POA: Diagnosis not present

## 2022-08-16 DIAGNOSIS — I739 Peripheral vascular disease, unspecified: Secondary | ICD-10-CM | POA: Diagnosis not present

## 2022-08-16 DIAGNOSIS — E1142 Type 2 diabetes mellitus with diabetic polyneuropathy: Secondary | ICD-10-CM | POA: Diagnosis not present

## 2022-08-16 DIAGNOSIS — G311 Senile degeneration of brain, not elsewhere classified: Secondary | ICD-10-CM | POA: Diagnosis not present

## 2022-08-16 DIAGNOSIS — N1831 Chronic kidney disease, stage 3a: Secondary | ICD-10-CM | POA: Diagnosis not present

## 2022-08-16 DIAGNOSIS — F0280 Dementia in other diseases classified elsewhere without behavioral disturbance: Secondary | ICD-10-CM | POA: Diagnosis not present

## 2022-08-19 DIAGNOSIS — I739 Peripheral vascular disease, unspecified: Secondary | ICD-10-CM | POA: Diagnosis not present

## 2022-08-19 DIAGNOSIS — E1142 Type 2 diabetes mellitus with diabetic polyneuropathy: Secondary | ICD-10-CM | POA: Diagnosis not present

## 2022-08-19 DIAGNOSIS — F0280 Dementia in other diseases classified elsewhere without behavioral disturbance: Secondary | ICD-10-CM | POA: Diagnosis not present

## 2022-08-19 DIAGNOSIS — I129 Hypertensive chronic kidney disease with stage 1 through stage 4 chronic kidney disease, or unspecified chronic kidney disease: Secondary | ICD-10-CM | POA: Diagnosis not present

## 2022-08-19 DIAGNOSIS — G311 Senile degeneration of brain, not elsewhere classified: Secondary | ICD-10-CM | POA: Diagnosis not present

## 2022-08-19 DIAGNOSIS — N1831 Chronic kidney disease, stage 3a: Secondary | ICD-10-CM | POA: Diagnosis not present

## 2022-08-23 DIAGNOSIS — G311 Senile degeneration of brain, not elsewhere classified: Secondary | ICD-10-CM | POA: Diagnosis not present

## 2022-08-23 DIAGNOSIS — I739 Peripheral vascular disease, unspecified: Secondary | ICD-10-CM | POA: Diagnosis not present

## 2022-08-23 DIAGNOSIS — F0280 Dementia in other diseases classified elsewhere without behavioral disturbance: Secondary | ICD-10-CM | POA: Diagnosis not present

## 2022-08-23 DIAGNOSIS — E1142 Type 2 diabetes mellitus with diabetic polyneuropathy: Secondary | ICD-10-CM | POA: Diagnosis not present

## 2022-08-23 DIAGNOSIS — I129 Hypertensive chronic kidney disease with stage 1 through stage 4 chronic kidney disease, or unspecified chronic kidney disease: Secondary | ICD-10-CM | POA: Diagnosis not present

## 2022-08-23 DIAGNOSIS — N1831 Chronic kidney disease, stage 3a: Secondary | ICD-10-CM | POA: Diagnosis not present

## 2022-08-25 DIAGNOSIS — G311 Senile degeneration of brain, not elsewhere classified: Secondary | ICD-10-CM | POA: Diagnosis not present

## 2022-08-25 DIAGNOSIS — F0280 Dementia in other diseases classified elsewhere without behavioral disturbance: Secondary | ICD-10-CM | POA: Diagnosis not present

## 2022-08-25 DIAGNOSIS — I739 Peripheral vascular disease, unspecified: Secondary | ICD-10-CM | POA: Diagnosis not present

## 2022-08-25 DIAGNOSIS — I129 Hypertensive chronic kidney disease with stage 1 through stage 4 chronic kidney disease, or unspecified chronic kidney disease: Secondary | ICD-10-CM | POA: Diagnosis not present

## 2022-08-25 DIAGNOSIS — N1831 Chronic kidney disease, stage 3a: Secondary | ICD-10-CM | POA: Diagnosis not present

## 2022-08-25 DIAGNOSIS — E1142 Type 2 diabetes mellitus with diabetic polyneuropathy: Secondary | ICD-10-CM | POA: Diagnosis not present

## 2022-08-29 DIAGNOSIS — F0280 Dementia in other diseases classified elsewhere without behavioral disturbance: Secondary | ICD-10-CM | POA: Diagnosis not present

## 2022-08-29 DIAGNOSIS — I129 Hypertensive chronic kidney disease with stage 1 through stage 4 chronic kidney disease, or unspecified chronic kidney disease: Secondary | ICD-10-CM | POA: Diagnosis not present

## 2022-08-29 DIAGNOSIS — G311 Senile degeneration of brain, not elsewhere classified: Secondary | ICD-10-CM | POA: Diagnosis not present

## 2022-08-29 DIAGNOSIS — E1142 Type 2 diabetes mellitus with diabetic polyneuropathy: Secondary | ICD-10-CM | POA: Diagnosis not present

## 2022-08-29 DIAGNOSIS — N1831 Chronic kidney disease, stage 3a: Secondary | ICD-10-CM | POA: Diagnosis not present

## 2022-08-29 DIAGNOSIS — I739 Peripheral vascular disease, unspecified: Secondary | ICD-10-CM | POA: Diagnosis not present

## 2022-08-30 DIAGNOSIS — E1142 Type 2 diabetes mellitus with diabetic polyneuropathy: Secondary | ICD-10-CM | POA: Diagnosis not present

## 2022-08-30 DIAGNOSIS — F0280 Dementia in other diseases classified elsewhere without behavioral disturbance: Secondary | ICD-10-CM | POA: Diagnosis not present

## 2022-08-30 DIAGNOSIS — G311 Senile degeneration of brain, not elsewhere classified: Secondary | ICD-10-CM | POA: Diagnosis not present

## 2022-08-30 DIAGNOSIS — I739 Peripheral vascular disease, unspecified: Secondary | ICD-10-CM | POA: Diagnosis not present

## 2022-08-30 DIAGNOSIS — I129 Hypertensive chronic kidney disease with stage 1 through stage 4 chronic kidney disease, or unspecified chronic kidney disease: Secondary | ICD-10-CM | POA: Diagnosis not present

## 2022-08-30 DIAGNOSIS — N1831 Chronic kidney disease, stage 3a: Secondary | ICD-10-CM | POA: Diagnosis not present

## 2022-09-01 DIAGNOSIS — I129 Hypertensive chronic kidney disease with stage 1 through stage 4 chronic kidney disease, or unspecified chronic kidney disease: Secondary | ICD-10-CM | POA: Diagnosis not present

## 2022-09-01 DIAGNOSIS — G311 Senile degeneration of brain, not elsewhere classified: Secondary | ICD-10-CM | POA: Diagnosis not present

## 2022-09-01 DIAGNOSIS — F0280 Dementia in other diseases classified elsewhere without behavioral disturbance: Secondary | ICD-10-CM | POA: Diagnosis not present

## 2022-09-01 DIAGNOSIS — I739 Peripheral vascular disease, unspecified: Secondary | ICD-10-CM | POA: Diagnosis not present

## 2022-09-01 DIAGNOSIS — E1142 Type 2 diabetes mellitus with diabetic polyneuropathy: Secondary | ICD-10-CM | POA: Diagnosis not present

## 2022-09-01 DIAGNOSIS — N1831 Chronic kidney disease, stage 3a: Secondary | ICD-10-CM | POA: Diagnosis not present

## 2022-09-05 DIAGNOSIS — I129 Hypertensive chronic kidney disease with stage 1 through stage 4 chronic kidney disease, or unspecified chronic kidney disease: Secondary | ICD-10-CM | POA: Diagnosis not present

## 2022-09-05 DIAGNOSIS — F0280 Dementia in other diseases classified elsewhere without behavioral disturbance: Secondary | ICD-10-CM | POA: Diagnosis not present

## 2022-09-05 DIAGNOSIS — G311 Senile degeneration of brain, not elsewhere classified: Secondary | ICD-10-CM | POA: Diagnosis not present

## 2022-09-05 DIAGNOSIS — I739 Peripheral vascular disease, unspecified: Secondary | ICD-10-CM | POA: Diagnosis not present

## 2022-09-05 DIAGNOSIS — N1831 Chronic kidney disease, stage 3a: Secondary | ICD-10-CM | POA: Diagnosis not present

## 2022-09-05 DIAGNOSIS — E1142 Type 2 diabetes mellitus with diabetic polyneuropathy: Secondary | ICD-10-CM | POA: Diagnosis not present

## 2022-09-06 DIAGNOSIS — I739 Peripheral vascular disease, unspecified: Secondary | ICD-10-CM | POA: Diagnosis not present

## 2022-09-06 DIAGNOSIS — F0280 Dementia in other diseases classified elsewhere without behavioral disturbance: Secondary | ICD-10-CM | POA: Diagnosis not present

## 2022-09-06 DIAGNOSIS — N1831 Chronic kidney disease, stage 3a: Secondary | ICD-10-CM | POA: Diagnosis not present

## 2022-09-06 DIAGNOSIS — I129 Hypertensive chronic kidney disease with stage 1 through stage 4 chronic kidney disease, or unspecified chronic kidney disease: Secondary | ICD-10-CM | POA: Diagnosis not present

## 2022-09-06 DIAGNOSIS — E1142 Type 2 diabetes mellitus with diabetic polyneuropathy: Secondary | ICD-10-CM | POA: Diagnosis not present

## 2022-09-06 DIAGNOSIS — G311 Senile degeneration of brain, not elsewhere classified: Secondary | ICD-10-CM | POA: Diagnosis not present

## 2022-09-08 DIAGNOSIS — I739 Peripheral vascular disease, unspecified: Secondary | ICD-10-CM | POA: Diagnosis not present

## 2022-09-08 DIAGNOSIS — E1142 Type 2 diabetes mellitus with diabetic polyneuropathy: Secondary | ICD-10-CM | POA: Diagnosis not present

## 2022-09-08 DIAGNOSIS — G311 Senile degeneration of brain, not elsewhere classified: Secondary | ICD-10-CM | POA: Diagnosis not present

## 2022-09-08 DIAGNOSIS — N1831 Chronic kidney disease, stage 3a: Secondary | ICD-10-CM | POA: Diagnosis not present

## 2022-09-08 DIAGNOSIS — I129 Hypertensive chronic kidney disease with stage 1 through stage 4 chronic kidney disease, or unspecified chronic kidney disease: Secondary | ICD-10-CM | POA: Diagnosis not present

## 2022-09-08 DIAGNOSIS — F0280 Dementia in other diseases classified elsewhere without behavioral disturbance: Secondary | ICD-10-CM | POA: Diagnosis not present

## 2022-09-13 DIAGNOSIS — I739 Peripheral vascular disease, unspecified: Secondary | ICD-10-CM | POA: Diagnosis not present

## 2022-09-13 DIAGNOSIS — N1831 Chronic kidney disease, stage 3a: Secondary | ICD-10-CM | POA: Diagnosis not present

## 2022-09-13 DIAGNOSIS — F0280 Dementia in other diseases classified elsewhere without behavioral disturbance: Secondary | ICD-10-CM | POA: Diagnosis not present

## 2022-09-13 DIAGNOSIS — G311 Senile degeneration of brain, not elsewhere classified: Secondary | ICD-10-CM | POA: Diagnosis not present

## 2022-09-13 DIAGNOSIS — I129 Hypertensive chronic kidney disease with stage 1 through stage 4 chronic kidney disease, or unspecified chronic kidney disease: Secondary | ICD-10-CM | POA: Diagnosis not present

## 2022-09-13 DIAGNOSIS — E1142 Type 2 diabetes mellitus with diabetic polyneuropathy: Secondary | ICD-10-CM | POA: Diagnosis not present

## 2022-09-15 DIAGNOSIS — E785 Hyperlipidemia, unspecified: Secondary | ICD-10-CM | POA: Diagnosis not present

## 2022-09-15 DIAGNOSIS — F0280 Dementia in other diseases classified elsewhere without behavioral disturbance: Secondary | ICD-10-CM | POA: Diagnosis not present

## 2022-09-15 DIAGNOSIS — G311 Senile degeneration of brain, not elsewhere classified: Secondary | ICD-10-CM | POA: Diagnosis not present

## 2022-09-15 DIAGNOSIS — L89152 Pressure ulcer of sacral region, stage 2: Secondary | ICD-10-CM | POA: Diagnosis not present

## 2022-09-15 DIAGNOSIS — E1142 Type 2 diabetes mellitus with diabetic polyneuropathy: Secondary | ICD-10-CM | POA: Diagnosis not present

## 2022-09-15 DIAGNOSIS — F329 Major depressive disorder, single episode, unspecified: Secondary | ICD-10-CM | POA: Diagnosis not present

## 2022-09-15 DIAGNOSIS — N1831 Chronic kidney disease, stage 3a: Secondary | ICD-10-CM | POA: Diagnosis not present

## 2022-09-15 DIAGNOSIS — I739 Peripheral vascular disease, unspecified: Secondary | ICD-10-CM | POA: Diagnosis not present

## 2022-09-15 DIAGNOSIS — R269 Unspecified abnormalities of gait and mobility: Secondary | ICD-10-CM | POA: Diagnosis not present

## 2022-09-15 DIAGNOSIS — Z8673 Personal history of transient ischemic attack (TIA), and cerebral infarction without residual deficits: Secondary | ICD-10-CM | POA: Diagnosis not present

## 2022-09-15 DIAGNOSIS — E039 Hypothyroidism, unspecified: Secondary | ICD-10-CM | POA: Diagnosis not present

## 2022-09-15 DIAGNOSIS — I129 Hypertensive chronic kidney disease with stage 1 through stage 4 chronic kidney disease, or unspecified chronic kidney disease: Secondary | ICD-10-CM | POA: Diagnosis not present

## 2022-09-16 DIAGNOSIS — I739 Peripheral vascular disease, unspecified: Secondary | ICD-10-CM | POA: Diagnosis not present

## 2022-09-16 DIAGNOSIS — G311 Senile degeneration of brain, not elsewhere classified: Secondary | ICD-10-CM | POA: Diagnosis not present

## 2022-09-16 DIAGNOSIS — E1142 Type 2 diabetes mellitus with diabetic polyneuropathy: Secondary | ICD-10-CM | POA: Diagnosis not present

## 2022-09-16 DIAGNOSIS — I129 Hypertensive chronic kidney disease with stage 1 through stage 4 chronic kidney disease, or unspecified chronic kidney disease: Secondary | ICD-10-CM | POA: Diagnosis not present

## 2022-09-16 DIAGNOSIS — N1831 Chronic kidney disease, stage 3a: Secondary | ICD-10-CM | POA: Diagnosis not present

## 2022-09-16 DIAGNOSIS — F0280 Dementia in other diseases classified elsewhere without behavioral disturbance: Secondary | ICD-10-CM | POA: Diagnosis not present

## 2022-09-20 DIAGNOSIS — G311 Senile degeneration of brain, not elsewhere classified: Secondary | ICD-10-CM | POA: Diagnosis not present

## 2022-09-20 DIAGNOSIS — E1142 Type 2 diabetes mellitus with diabetic polyneuropathy: Secondary | ICD-10-CM | POA: Diagnosis not present

## 2022-09-20 DIAGNOSIS — N1831 Chronic kidney disease, stage 3a: Secondary | ICD-10-CM | POA: Diagnosis not present

## 2022-09-20 DIAGNOSIS — I739 Peripheral vascular disease, unspecified: Secondary | ICD-10-CM | POA: Diagnosis not present

## 2022-09-20 DIAGNOSIS — F0280 Dementia in other diseases classified elsewhere without behavioral disturbance: Secondary | ICD-10-CM | POA: Diagnosis not present

## 2022-09-20 DIAGNOSIS — I129 Hypertensive chronic kidney disease with stage 1 through stage 4 chronic kidney disease, or unspecified chronic kidney disease: Secondary | ICD-10-CM | POA: Diagnosis not present

## 2022-09-22 DIAGNOSIS — E1142 Type 2 diabetes mellitus with diabetic polyneuropathy: Secondary | ICD-10-CM | POA: Diagnosis not present

## 2022-09-22 DIAGNOSIS — N1831 Chronic kidney disease, stage 3a: Secondary | ICD-10-CM | POA: Diagnosis not present

## 2022-09-22 DIAGNOSIS — I739 Peripheral vascular disease, unspecified: Secondary | ICD-10-CM | POA: Diagnosis not present

## 2022-09-22 DIAGNOSIS — F0280 Dementia in other diseases classified elsewhere without behavioral disturbance: Secondary | ICD-10-CM | POA: Diagnosis not present

## 2022-09-22 DIAGNOSIS — I129 Hypertensive chronic kidney disease with stage 1 through stage 4 chronic kidney disease, or unspecified chronic kidney disease: Secondary | ICD-10-CM | POA: Diagnosis not present

## 2022-09-22 DIAGNOSIS — G311 Senile degeneration of brain, not elsewhere classified: Secondary | ICD-10-CM | POA: Diagnosis not present

## 2022-09-27 DIAGNOSIS — E1142 Type 2 diabetes mellitus with diabetic polyneuropathy: Secondary | ICD-10-CM | POA: Diagnosis not present

## 2022-09-27 DIAGNOSIS — F0280 Dementia in other diseases classified elsewhere without behavioral disturbance: Secondary | ICD-10-CM | POA: Diagnosis not present

## 2022-09-27 DIAGNOSIS — I129 Hypertensive chronic kidney disease with stage 1 through stage 4 chronic kidney disease, or unspecified chronic kidney disease: Secondary | ICD-10-CM | POA: Diagnosis not present

## 2022-09-27 DIAGNOSIS — N1831 Chronic kidney disease, stage 3a: Secondary | ICD-10-CM | POA: Diagnosis not present

## 2022-09-27 DIAGNOSIS — I739 Peripheral vascular disease, unspecified: Secondary | ICD-10-CM | POA: Diagnosis not present

## 2022-09-27 DIAGNOSIS — G311 Senile degeneration of brain, not elsewhere classified: Secondary | ICD-10-CM | POA: Diagnosis not present

## 2022-09-29 DIAGNOSIS — E1142 Type 2 diabetes mellitus with diabetic polyneuropathy: Secondary | ICD-10-CM | POA: Diagnosis not present

## 2022-09-29 DIAGNOSIS — I129 Hypertensive chronic kidney disease with stage 1 through stage 4 chronic kidney disease, or unspecified chronic kidney disease: Secondary | ICD-10-CM | POA: Diagnosis not present

## 2022-09-29 DIAGNOSIS — I739 Peripheral vascular disease, unspecified: Secondary | ICD-10-CM | POA: Diagnosis not present

## 2022-09-29 DIAGNOSIS — G311 Senile degeneration of brain, not elsewhere classified: Secondary | ICD-10-CM | POA: Diagnosis not present

## 2022-09-29 DIAGNOSIS — N1831 Chronic kidney disease, stage 3a: Secondary | ICD-10-CM | POA: Diagnosis not present

## 2022-09-29 DIAGNOSIS — F0280 Dementia in other diseases classified elsewhere without behavioral disturbance: Secondary | ICD-10-CM | POA: Diagnosis not present

## 2022-10-04 DIAGNOSIS — I739 Peripheral vascular disease, unspecified: Secondary | ICD-10-CM | POA: Diagnosis not present

## 2022-10-04 DIAGNOSIS — E1142 Type 2 diabetes mellitus with diabetic polyneuropathy: Secondary | ICD-10-CM | POA: Diagnosis not present

## 2022-10-04 DIAGNOSIS — F0280 Dementia in other diseases classified elsewhere without behavioral disturbance: Secondary | ICD-10-CM | POA: Diagnosis not present

## 2022-10-04 DIAGNOSIS — I129 Hypertensive chronic kidney disease with stage 1 through stage 4 chronic kidney disease, or unspecified chronic kidney disease: Secondary | ICD-10-CM | POA: Diagnosis not present

## 2022-10-04 DIAGNOSIS — N1831 Chronic kidney disease, stage 3a: Secondary | ICD-10-CM | POA: Diagnosis not present

## 2022-10-04 DIAGNOSIS — G311 Senile degeneration of brain, not elsewhere classified: Secondary | ICD-10-CM | POA: Diagnosis not present

## 2022-10-07 DIAGNOSIS — I129 Hypertensive chronic kidney disease with stage 1 through stage 4 chronic kidney disease, or unspecified chronic kidney disease: Secondary | ICD-10-CM | POA: Diagnosis not present

## 2022-10-07 DIAGNOSIS — F0280 Dementia in other diseases classified elsewhere without behavioral disturbance: Secondary | ICD-10-CM | POA: Diagnosis not present

## 2022-10-07 DIAGNOSIS — N1831 Chronic kidney disease, stage 3a: Secondary | ICD-10-CM | POA: Diagnosis not present

## 2022-10-07 DIAGNOSIS — E1142 Type 2 diabetes mellitus with diabetic polyneuropathy: Secondary | ICD-10-CM | POA: Diagnosis not present

## 2022-10-07 DIAGNOSIS — G311 Senile degeneration of brain, not elsewhere classified: Secondary | ICD-10-CM | POA: Diagnosis not present

## 2022-10-07 DIAGNOSIS — I739 Peripheral vascular disease, unspecified: Secondary | ICD-10-CM | POA: Diagnosis not present

## 2022-10-11 DIAGNOSIS — N1831 Chronic kidney disease, stage 3a: Secondary | ICD-10-CM | POA: Diagnosis not present

## 2022-10-11 DIAGNOSIS — E1142 Type 2 diabetes mellitus with diabetic polyneuropathy: Secondary | ICD-10-CM | POA: Diagnosis not present

## 2022-10-11 DIAGNOSIS — I739 Peripheral vascular disease, unspecified: Secondary | ICD-10-CM | POA: Diagnosis not present

## 2022-10-11 DIAGNOSIS — G311 Senile degeneration of brain, not elsewhere classified: Secondary | ICD-10-CM | POA: Diagnosis not present

## 2022-10-11 DIAGNOSIS — I129 Hypertensive chronic kidney disease with stage 1 through stage 4 chronic kidney disease, or unspecified chronic kidney disease: Secondary | ICD-10-CM | POA: Diagnosis not present

## 2022-10-11 DIAGNOSIS — F0280 Dementia in other diseases classified elsewhere without behavioral disturbance: Secondary | ICD-10-CM | POA: Diagnosis not present

## 2022-10-14 DIAGNOSIS — F0280 Dementia in other diseases classified elsewhere without behavioral disturbance: Secondary | ICD-10-CM | POA: Diagnosis not present

## 2022-10-14 DIAGNOSIS — I739 Peripheral vascular disease, unspecified: Secondary | ICD-10-CM | POA: Diagnosis not present

## 2022-10-14 DIAGNOSIS — G311 Senile degeneration of brain, not elsewhere classified: Secondary | ICD-10-CM | POA: Diagnosis not present

## 2022-10-14 DIAGNOSIS — N1831 Chronic kidney disease, stage 3a: Secondary | ICD-10-CM | POA: Diagnosis not present

## 2022-10-14 DIAGNOSIS — I129 Hypertensive chronic kidney disease with stage 1 through stage 4 chronic kidney disease, or unspecified chronic kidney disease: Secondary | ICD-10-CM | POA: Diagnosis not present

## 2022-10-14 DIAGNOSIS — E1142 Type 2 diabetes mellitus with diabetic polyneuropathy: Secondary | ICD-10-CM | POA: Diagnosis not present

## 2022-10-16 DIAGNOSIS — F0280 Dementia in other diseases classified elsewhere without behavioral disturbance: Secondary | ICD-10-CM | POA: Diagnosis not present

## 2022-10-16 DIAGNOSIS — G311 Senile degeneration of brain, not elsewhere classified: Secondary | ICD-10-CM | POA: Diagnosis not present

## 2022-10-16 DIAGNOSIS — I129 Hypertensive chronic kidney disease with stage 1 through stage 4 chronic kidney disease, or unspecified chronic kidney disease: Secondary | ICD-10-CM | POA: Diagnosis not present

## 2022-10-16 DIAGNOSIS — F329 Major depressive disorder, single episode, unspecified: Secondary | ICD-10-CM | POA: Diagnosis not present

## 2022-10-16 DIAGNOSIS — E039 Hypothyroidism, unspecified: Secondary | ICD-10-CM | POA: Diagnosis not present

## 2022-10-16 DIAGNOSIS — I739 Peripheral vascular disease, unspecified: Secondary | ICD-10-CM | POA: Diagnosis not present

## 2022-10-16 DIAGNOSIS — L89152 Pressure ulcer of sacral region, stage 2: Secondary | ICD-10-CM | POA: Diagnosis not present

## 2022-10-16 DIAGNOSIS — R269 Unspecified abnormalities of gait and mobility: Secondary | ICD-10-CM | POA: Diagnosis not present

## 2022-10-16 DIAGNOSIS — E1142 Type 2 diabetes mellitus with diabetic polyneuropathy: Secondary | ICD-10-CM | POA: Diagnosis not present

## 2022-10-16 DIAGNOSIS — Z8673 Personal history of transient ischemic attack (TIA), and cerebral infarction without residual deficits: Secondary | ICD-10-CM | POA: Diagnosis not present

## 2022-10-16 DIAGNOSIS — E785 Hyperlipidemia, unspecified: Secondary | ICD-10-CM | POA: Diagnosis not present

## 2022-10-16 DIAGNOSIS — N1831 Chronic kidney disease, stage 3a: Secondary | ICD-10-CM | POA: Diagnosis not present

## 2022-10-18 DIAGNOSIS — G311 Senile degeneration of brain, not elsewhere classified: Secondary | ICD-10-CM | POA: Diagnosis not present

## 2022-10-18 DIAGNOSIS — E1142 Type 2 diabetes mellitus with diabetic polyneuropathy: Secondary | ICD-10-CM | POA: Diagnosis not present

## 2022-10-18 DIAGNOSIS — F0280 Dementia in other diseases classified elsewhere without behavioral disturbance: Secondary | ICD-10-CM | POA: Diagnosis not present

## 2022-10-18 DIAGNOSIS — I129 Hypertensive chronic kidney disease with stage 1 through stage 4 chronic kidney disease, or unspecified chronic kidney disease: Secondary | ICD-10-CM | POA: Diagnosis not present

## 2022-10-18 DIAGNOSIS — I739 Peripheral vascular disease, unspecified: Secondary | ICD-10-CM | POA: Diagnosis not present

## 2022-10-18 DIAGNOSIS — N1831 Chronic kidney disease, stage 3a: Secondary | ICD-10-CM | POA: Diagnosis not present

## 2022-10-20 DIAGNOSIS — N1831 Chronic kidney disease, stage 3a: Secondary | ICD-10-CM | POA: Diagnosis not present

## 2022-10-20 DIAGNOSIS — F0280 Dementia in other diseases classified elsewhere without behavioral disturbance: Secondary | ICD-10-CM | POA: Diagnosis not present

## 2022-10-20 DIAGNOSIS — G311 Senile degeneration of brain, not elsewhere classified: Secondary | ICD-10-CM | POA: Diagnosis not present

## 2022-10-20 DIAGNOSIS — I129 Hypertensive chronic kidney disease with stage 1 through stage 4 chronic kidney disease, or unspecified chronic kidney disease: Secondary | ICD-10-CM | POA: Diagnosis not present

## 2022-10-20 DIAGNOSIS — E1142 Type 2 diabetes mellitus with diabetic polyneuropathy: Secondary | ICD-10-CM | POA: Diagnosis not present

## 2022-10-20 DIAGNOSIS — I739 Peripheral vascular disease, unspecified: Secondary | ICD-10-CM | POA: Diagnosis not present

## 2022-10-25 DIAGNOSIS — I739 Peripheral vascular disease, unspecified: Secondary | ICD-10-CM | POA: Diagnosis not present

## 2022-10-25 DIAGNOSIS — F0280 Dementia in other diseases classified elsewhere without behavioral disturbance: Secondary | ICD-10-CM | POA: Diagnosis not present

## 2022-10-25 DIAGNOSIS — I129 Hypertensive chronic kidney disease with stage 1 through stage 4 chronic kidney disease, or unspecified chronic kidney disease: Secondary | ICD-10-CM | POA: Diagnosis not present

## 2022-10-25 DIAGNOSIS — G311 Senile degeneration of brain, not elsewhere classified: Secondary | ICD-10-CM | POA: Diagnosis not present

## 2022-10-25 DIAGNOSIS — N1831 Chronic kidney disease, stage 3a: Secondary | ICD-10-CM | POA: Diagnosis not present

## 2022-10-25 DIAGNOSIS — E1142 Type 2 diabetes mellitus with diabetic polyneuropathy: Secondary | ICD-10-CM | POA: Diagnosis not present

## 2022-10-27 DIAGNOSIS — F0280 Dementia in other diseases classified elsewhere without behavioral disturbance: Secondary | ICD-10-CM | POA: Diagnosis not present

## 2022-10-27 DIAGNOSIS — E1142 Type 2 diabetes mellitus with diabetic polyneuropathy: Secondary | ICD-10-CM | POA: Diagnosis not present

## 2022-10-27 DIAGNOSIS — N1831 Chronic kidney disease, stage 3a: Secondary | ICD-10-CM | POA: Diagnosis not present

## 2022-10-27 DIAGNOSIS — I739 Peripheral vascular disease, unspecified: Secondary | ICD-10-CM | POA: Diagnosis not present

## 2022-10-27 DIAGNOSIS — G311 Senile degeneration of brain, not elsewhere classified: Secondary | ICD-10-CM | POA: Diagnosis not present

## 2022-10-27 DIAGNOSIS — I129 Hypertensive chronic kidney disease with stage 1 through stage 4 chronic kidney disease, or unspecified chronic kidney disease: Secondary | ICD-10-CM | POA: Diagnosis not present

## 2022-11-01 DIAGNOSIS — I739 Peripheral vascular disease, unspecified: Secondary | ICD-10-CM | POA: Diagnosis not present

## 2022-11-01 DIAGNOSIS — F0280 Dementia in other diseases classified elsewhere without behavioral disturbance: Secondary | ICD-10-CM | POA: Diagnosis not present

## 2022-11-01 DIAGNOSIS — G311 Senile degeneration of brain, not elsewhere classified: Secondary | ICD-10-CM | POA: Diagnosis not present

## 2022-11-01 DIAGNOSIS — E1142 Type 2 diabetes mellitus with diabetic polyneuropathy: Secondary | ICD-10-CM | POA: Diagnosis not present

## 2022-11-01 DIAGNOSIS — N1831 Chronic kidney disease, stage 3a: Secondary | ICD-10-CM | POA: Diagnosis not present

## 2022-11-01 DIAGNOSIS — I129 Hypertensive chronic kidney disease with stage 1 through stage 4 chronic kidney disease, or unspecified chronic kidney disease: Secondary | ICD-10-CM | POA: Diagnosis not present

## 2022-11-03 DIAGNOSIS — I129 Hypertensive chronic kidney disease with stage 1 through stage 4 chronic kidney disease, or unspecified chronic kidney disease: Secondary | ICD-10-CM | POA: Diagnosis not present

## 2022-11-03 DIAGNOSIS — F0280 Dementia in other diseases classified elsewhere without behavioral disturbance: Secondary | ICD-10-CM | POA: Diagnosis not present

## 2022-11-03 DIAGNOSIS — G311 Senile degeneration of brain, not elsewhere classified: Secondary | ICD-10-CM | POA: Diagnosis not present

## 2022-11-03 DIAGNOSIS — N1831 Chronic kidney disease, stage 3a: Secondary | ICD-10-CM | POA: Diagnosis not present

## 2022-11-03 DIAGNOSIS — I739 Peripheral vascular disease, unspecified: Secondary | ICD-10-CM | POA: Diagnosis not present

## 2022-11-03 DIAGNOSIS — E1142 Type 2 diabetes mellitus with diabetic polyneuropathy: Secondary | ICD-10-CM | POA: Diagnosis not present

## 2022-11-07 DIAGNOSIS — F0280 Dementia in other diseases classified elsewhere without behavioral disturbance: Secondary | ICD-10-CM | POA: Diagnosis not present

## 2022-11-07 DIAGNOSIS — I129 Hypertensive chronic kidney disease with stage 1 through stage 4 chronic kidney disease, or unspecified chronic kidney disease: Secondary | ICD-10-CM | POA: Diagnosis not present

## 2022-11-07 DIAGNOSIS — I739 Peripheral vascular disease, unspecified: Secondary | ICD-10-CM | POA: Diagnosis not present

## 2022-11-07 DIAGNOSIS — N1831 Chronic kidney disease, stage 3a: Secondary | ICD-10-CM | POA: Diagnosis not present

## 2022-11-07 DIAGNOSIS — E1142 Type 2 diabetes mellitus with diabetic polyneuropathy: Secondary | ICD-10-CM | POA: Diagnosis not present

## 2022-11-07 DIAGNOSIS — G311 Senile degeneration of brain, not elsewhere classified: Secondary | ICD-10-CM | POA: Diagnosis not present

## 2022-11-08 DIAGNOSIS — N1831 Chronic kidney disease, stage 3a: Secondary | ICD-10-CM | POA: Diagnosis not present

## 2022-11-08 DIAGNOSIS — I129 Hypertensive chronic kidney disease with stage 1 through stage 4 chronic kidney disease, or unspecified chronic kidney disease: Secondary | ICD-10-CM | POA: Diagnosis not present

## 2022-11-08 DIAGNOSIS — E1142 Type 2 diabetes mellitus with diabetic polyneuropathy: Secondary | ICD-10-CM | POA: Diagnosis not present

## 2022-11-08 DIAGNOSIS — F0280 Dementia in other diseases classified elsewhere without behavioral disturbance: Secondary | ICD-10-CM | POA: Diagnosis not present

## 2022-11-08 DIAGNOSIS — I739 Peripheral vascular disease, unspecified: Secondary | ICD-10-CM | POA: Diagnosis not present

## 2022-11-08 DIAGNOSIS — G311 Senile degeneration of brain, not elsewhere classified: Secondary | ICD-10-CM | POA: Diagnosis not present

## 2022-11-11 DIAGNOSIS — G311 Senile degeneration of brain, not elsewhere classified: Secondary | ICD-10-CM | POA: Diagnosis not present

## 2022-11-11 DIAGNOSIS — E1142 Type 2 diabetes mellitus with diabetic polyneuropathy: Secondary | ICD-10-CM | POA: Diagnosis not present

## 2022-11-11 DIAGNOSIS — I129 Hypertensive chronic kidney disease with stage 1 through stage 4 chronic kidney disease, or unspecified chronic kidney disease: Secondary | ICD-10-CM | POA: Diagnosis not present

## 2022-11-11 DIAGNOSIS — N1831 Chronic kidney disease, stage 3a: Secondary | ICD-10-CM | POA: Diagnosis not present

## 2022-11-11 DIAGNOSIS — I739 Peripheral vascular disease, unspecified: Secondary | ICD-10-CM | POA: Diagnosis not present

## 2022-11-11 DIAGNOSIS — F0280 Dementia in other diseases classified elsewhere without behavioral disturbance: Secondary | ICD-10-CM | POA: Diagnosis not present

## 2022-11-14 DIAGNOSIS — E1142 Type 2 diabetes mellitus with diabetic polyneuropathy: Secondary | ICD-10-CM | POA: Diagnosis not present

## 2022-11-14 DIAGNOSIS — N1831 Chronic kidney disease, stage 3a: Secondary | ICD-10-CM | POA: Diagnosis not present

## 2022-11-14 DIAGNOSIS — G311 Senile degeneration of brain, not elsewhere classified: Secondary | ICD-10-CM | POA: Diagnosis not present

## 2022-11-14 DIAGNOSIS — I739 Peripheral vascular disease, unspecified: Secondary | ICD-10-CM | POA: Diagnosis not present

## 2022-11-14 DIAGNOSIS — I129 Hypertensive chronic kidney disease with stage 1 through stage 4 chronic kidney disease, or unspecified chronic kidney disease: Secondary | ICD-10-CM | POA: Diagnosis not present

## 2022-11-14 DIAGNOSIS — F0280 Dementia in other diseases classified elsewhere without behavioral disturbance: Secondary | ICD-10-CM | POA: Diagnosis not present

## 2022-11-15 DIAGNOSIS — E1142 Type 2 diabetes mellitus with diabetic polyneuropathy: Secondary | ICD-10-CM | POA: Diagnosis not present

## 2022-11-15 DIAGNOSIS — I129 Hypertensive chronic kidney disease with stage 1 through stage 4 chronic kidney disease, or unspecified chronic kidney disease: Secondary | ICD-10-CM | POA: Diagnosis not present

## 2022-11-15 DIAGNOSIS — I739 Peripheral vascular disease, unspecified: Secondary | ICD-10-CM | POA: Diagnosis not present

## 2022-11-15 DIAGNOSIS — G311 Senile degeneration of brain, not elsewhere classified: Secondary | ICD-10-CM | POA: Diagnosis not present

## 2022-11-15 DIAGNOSIS — E785 Hyperlipidemia, unspecified: Secondary | ICD-10-CM | POA: Diagnosis not present

## 2022-11-15 DIAGNOSIS — R269 Unspecified abnormalities of gait and mobility: Secondary | ICD-10-CM | POA: Diagnosis not present

## 2022-11-15 DIAGNOSIS — F329 Major depressive disorder, single episode, unspecified: Secondary | ICD-10-CM | POA: Diagnosis not present

## 2022-11-15 DIAGNOSIS — L89152 Pressure ulcer of sacral region, stage 2: Secondary | ICD-10-CM | POA: Diagnosis not present

## 2022-11-15 DIAGNOSIS — E039 Hypothyroidism, unspecified: Secondary | ICD-10-CM | POA: Diagnosis not present

## 2022-11-15 DIAGNOSIS — Z8673 Personal history of transient ischemic attack (TIA), and cerebral infarction without residual deficits: Secondary | ICD-10-CM | POA: Diagnosis not present

## 2022-11-15 DIAGNOSIS — N1831 Chronic kidney disease, stage 3a: Secondary | ICD-10-CM | POA: Diagnosis not present

## 2022-11-15 DIAGNOSIS — F0280 Dementia in other diseases classified elsewhere without behavioral disturbance: Secondary | ICD-10-CM | POA: Diagnosis not present

## 2022-11-18 DIAGNOSIS — F0280 Dementia in other diseases classified elsewhere without behavioral disturbance: Secondary | ICD-10-CM | POA: Diagnosis not present

## 2022-11-18 DIAGNOSIS — N1831 Chronic kidney disease, stage 3a: Secondary | ICD-10-CM | POA: Diagnosis not present

## 2022-11-18 DIAGNOSIS — E1142 Type 2 diabetes mellitus with diabetic polyneuropathy: Secondary | ICD-10-CM | POA: Diagnosis not present

## 2022-11-18 DIAGNOSIS — I129 Hypertensive chronic kidney disease with stage 1 through stage 4 chronic kidney disease, or unspecified chronic kidney disease: Secondary | ICD-10-CM | POA: Diagnosis not present

## 2022-11-18 DIAGNOSIS — I739 Peripheral vascular disease, unspecified: Secondary | ICD-10-CM | POA: Diagnosis not present

## 2022-11-18 DIAGNOSIS — G311 Senile degeneration of brain, not elsewhere classified: Secondary | ICD-10-CM | POA: Diagnosis not present

## 2022-11-21 DIAGNOSIS — G311 Senile degeneration of brain, not elsewhere classified: Secondary | ICD-10-CM | POA: Diagnosis not present

## 2022-11-21 DIAGNOSIS — I739 Peripheral vascular disease, unspecified: Secondary | ICD-10-CM | POA: Diagnosis not present

## 2022-11-21 DIAGNOSIS — E1142 Type 2 diabetes mellitus with diabetic polyneuropathy: Secondary | ICD-10-CM | POA: Diagnosis not present

## 2022-11-21 DIAGNOSIS — N1831 Chronic kidney disease, stage 3a: Secondary | ICD-10-CM | POA: Diagnosis not present

## 2022-11-21 DIAGNOSIS — I129 Hypertensive chronic kidney disease with stage 1 through stage 4 chronic kidney disease, or unspecified chronic kidney disease: Secondary | ICD-10-CM | POA: Diagnosis not present

## 2022-11-21 DIAGNOSIS — F0280 Dementia in other diseases classified elsewhere without behavioral disturbance: Secondary | ICD-10-CM | POA: Diagnosis not present

## 2022-11-22 DIAGNOSIS — I739 Peripheral vascular disease, unspecified: Secondary | ICD-10-CM | POA: Diagnosis not present

## 2022-11-22 DIAGNOSIS — I129 Hypertensive chronic kidney disease with stage 1 through stage 4 chronic kidney disease, or unspecified chronic kidney disease: Secondary | ICD-10-CM | POA: Diagnosis not present

## 2022-11-22 DIAGNOSIS — N1831 Chronic kidney disease, stage 3a: Secondary | ICD-10-CM | POA: Diagnosis not present

## 2022-11-22 DIAGNOSIS — F0280 Dementia in other diseases classified elsewhere without behavioral disturbance: Secondary | ICD-10-CM | POA: Diagnosis not present

## 2022-11-22 DIAGNOSIS — G311 Senile degeneration of brain, not elsewhere classified: Secondary | ICD-10-CM | POA: Diagnosis not present

## 2022-11-22 DIAGNOSIS — E1142 Type 2 diabetes mellitus with diabetic polyneuropathy: Secondary | ICD-10-CM | POA: Diagnosis not present

## 2022-11-25 DIAGNOSIS — G311 Senile degeneration of brain, not elsewhere classified: Secondary | ICD-10-CM | POA: Diagnosis not present

## 2022-11-25 DIAGNOSIS — I739 Peripheral vascular disease, unspecified: Secondary | ICD-10-CM | POA: Diagnosis not present

## 2022-11-25 DIAGNOSIS — E1142 Type 2 diabetes mellitus with diabetic polyneuropathy: Secondary | ICD-10-CM | POA: Diagnosis not present

## 2022-11-25 DIAGNOSIS — N1831 Chronic kidney disease, stage 3a: Secondary | ICD-10-CM | POA: Diagnosis not present

## 2022-11-25 DIAGNOSIS — I129 Hypertensive chronic kidney disease with stage 1 through stage 4 chronic kidney disease, or unspecified chronic kidney disease: Secondary | ICD-10-CM | POA: Diagnosis not present

## 2022-11-25 DIAGNOSIS — F0280 Dementia in other diseases classified elsewhere without behavioral disturbance: Secondary | ICD-10-CM | POA: Diagnosis not present

## 2022-11-28 DIAGNOSIS — G311 Senile degeneration of brain, not elsewhere classified: Secondary | ICD-10-CM | POA: Diagnosis not present

## 2022-11-28 DIAGNOSIS — I129 Hypertensive chronic kidney disease with stage 1 through stage 4 chronic kidney disease, or unspecified chronic kidney disease: Secondary | ICD-10-CM | POA: Diagnosis not present

## 2022-11-28 DIAGNOSIS — E1142 Type 2 diabetes mellitus with diabetic polyneuropathy: Secondary | ICD-10-CM | POA: Diagnosis not present

## 2022-11-28 DIAGNOSIS — I739 Peripheral vascular disease, unspecified: Secondary | ICD-10-CM | POA: Diagnosis not present

## 2022-11-28 DIAGNOSIS — N1831 Chronic kidney disease, stage 3a: Secondary | ICD-10-CM | POA: Diagnosis not present

## 2022-11-28 DIAGNOSIS — F0280 Dementia in other diseases classified elsewhere without behavioral disturbance: Secondary | ICD-10-CM | POA: Diagnosis not present

## 2022-11-29 DIAGNOSIS — I129 Hypertensive chronic kidney disease with stage 1 through stage 4 chronic kidney disease, or unspecified chronic kidney disease: Secondary | ICD-10-CM | POA: Diagnosis not present

## 2022-11-29 DIAGNOSIS — N1831 Chronic kidney disease, stage 3a: Secondary | ICD-10-CM | POA: Diagnosis not present

## 2022-11-29 DIAGNOSIS — G311 Senile degeneration of brain, not elsewhere classified: Secondary | ICD-10-CM | POA: Diagnosis not present

## 2022-11-29 DIAGNOSIS — E1142 Type 2 diabetes mellitus with diabetic polyneuropathy: Secondary | ICD-10-CM | POA: Diagnosis not present

## 2022-11-29 DIAGNOSIS — I739 Peripheral vascular disease, unspecified: Secondary | ICD-10-CM | POA: Diagnosis not present

## 2022-11-29 DIAGNOSIS — F0280 Dementia in other diseases classified elsewhere without behavioral disturbance: Secondary | ICD-10-CM | POA: Diagnosis not present

## 2022-12-02 DIAGNOSIS — E1142 Type 2 diabetes mellitus with diabetic polyneuropathy: Secondary | ICD-10-CM | POA: Diagnosis not present

## 2022-12-02 DIAGNOSIS — I739 Peripheral vascular disease, unspecified: Secondary | ICD-10-CM | POA: Diagnosis not present

## 2022-12-02 DIAGNOSIS — N1831 Chronic kidney disease, stage 3a: Secondary | ICD-10-CM | POA: Diagnosis not present

## 2022-12-02 DIAGNOSIS — F0280 Dementia in other diseases classified elsewhere without behavioral disturbance: Secondary | ICD-10-CM | POA: Diagnosis not present

## 2022-12-02 DIAGNOSIS — G311 Senile degeneration of brain, not elsewhere classified: Secondary | ICD-10-CM | POA: Diagnosis not present

## 2022-12-02 DIAGNOSIS — I129 Hypertensive chronic kidney disease with stage 1 through stage 4 chronic kidney disease, or unspecified chronic kidney disease: Secondary | ICD-10-CM | POA: Diagnosis not present

## 2022-12-05 DIAGNOSIS — F0280 Dementia in other diseases classified elsewhere without behavioral disturbance: Secondary | ICD-10-CM | POA: Diagnosis not present

## 2022-12-05 DIAGNOSIS — I129 Hypertensive chronic kidney disease with stage 1 through stage 4 chronic kidney disease, or unspecified chronic kidney disease: Secondary | ICD-10-CM | POA: Diagnosis not present

## 2022-12-05 DIAGNOSIS — E1142 Type 2 diabetes mellitus with diabetic polyneuropathy: Secondary | ICD-10-CM | POA: Diagnosis not present

## 2022-12-05 DIAGNOSIS — G311 Senile degeneration of brain, not elsewhere classified: Secondary | ICD-10-CM | POA: Diagnosis not present

## 2022-12-05 DIAGNOSIS — N1831 Chronic kidney disease, stage 3a: Secondary | ICD-10-CM | POA: Diagnosis not present

## 2022-12-05 DIAGNOSIS — I739 Peripheral vascular disease, unspecified: Secondary | ICD-10-CM | POA: Diagnosis not present

## 2022-12-06 DIAGNOSIS — G311 Senile degeneration of brain, not elsewhere classified: Secondary | ICD-10-CM | POA: Diagnosis not present

## 2022-12-06 DIAGNOSIS — F0280 Dementia in other diseases classified elsewhere without behavioral disturbance: Secondary | ICD-10-CM | POA: Diagnosis not present

## 2022-12-06 DIAGNOSIS — N1831 Chronic kidney disease, stage 3a: Secondary | ICD-10-CM | POA: Diagnosis not present

## 2022-12-06 DIAGNOSIS — E1142 Type 2 diabetes mellitus with diabetic polyneuropathy: Secondary | ICD-10-CM | POA: Diagnosis not present

## 2022-12-06 DIAGNOSIS — I739 Peripheral vascular disease, unspecified: Secondary | ICD-10-CM | POA: Diagnosis not present

## 2022-12-06 DIAGNOSIS — I129 Hypertensive chronic kidney disease with stage 1 through stage 4 chronic kidney disease, or unspecified chronic kidney disease: Secondary | ICD-10-CM | POA: Diagnosis not present

## 2022-12-09 DIAGNOSIS — I129 Hypertensive chronic kidney disease with stage 1 through stage 4 chronic kidney disease, or unspecified chronic kidney disease: Secondary | ICD-10-CM | POA: Diagnosis not present

## 2022-12-09 DIAGNOSIS — F0280 Dementia in other diseases classified elsewhere without behavioral disturbance: Secondary | ICD-10-CM | POA: Diagnosis not present

## 2022-12-09 DIAGNOSIS — E1142 Type 2 diabetes mellitus with diabetic polyneuropathy: Secondary | ICD-10-CM | POA: Diagnosis not present

## 2022-12-09 DIAGNOSIS — G311 Senile degeneration of brain, not elsewhere classified: Secondary | ICD-10-CM | POA: Diagnosis not present

## 2022-12-09 DIAGNOSIS — N1831 Chronic kidney disease, stage 3a: Secondary | ICD-10-CM | POA: Diagnosis not present

## 2022-12-09 DIAGNOSIS — I739 Peripheral vascular disease, unspecified: Secondary | ICD-10-CM | POA: Diagnosis not present

## 2022-12-12 DIAGNOSIS — N1831 Chronic kidney disease, stage 3a: Secondary | ICD-10-CM | POA: Diagnosis not present

## 2022-12-12 DIAGNOSIS — G311 Senile degeneration of brain, not elsewhere classified: Secondary | ICD-10-CM | POA: Diagnosis not present

## 2022-12-12 DIAGNOSIS — E1142 Type 2 diabetes mellitus with diabetic polyneuropathy: Secondary | ICD-10-CM | POA: Diagnosis not present

## 2022-12-12 DIAGNOSIS — I129 Hypertensive chronic kidney disease with stage 1 through stage 4 chronic kidney disease, or unspecified chronic kidney disease: Secondary | ICD-10-CM | POA: Diagnosis not present

## 2022-12-12 DIAGNOSIS — I739 Peripheral vascular disease, unspecified: Secondary | ICD-10-CM | POA: Diagnosis not present

## 2022-12-12 DIAGNOSIS — F0280 Dementia in other diseases classified elsewhere without behavioral disturbance: Secondary | ICD-10-CM | POA: Diagnosis not present

## 2022-12-13 DIAGNOSIS — N1831 Chronic kidney disease, stage 3a: Secondary | ICD-10-CM | POA: Diagnosis not present

## 2022-12-13 DIAGNOSIS — F0280 Dementia in other diseases classified elsewhere without behavioral disturbance: Secondary | ICD-10-CM | POA: Diagnosis not present

## 2022-12-13 DIAGNOSIS — I129 Hypertensive chronic kidney disease with stage 1 through stage 4 chronic kidney disease, or unspecified chronic kidney disease: Secondary | ICD-10-CM | POA: Diagnosis not present

## 2022-12-13 DIAGNOSIS — G311 Senile degeneration of brain, not elsewhere classified: Secondary | ICD-10-CM | POA: Diagnosis not present

## 2022-12-13 DIAGNOSIS — I739 Peripheral vascular disease, unspecified: Secondary | ICD-10-CM | POA: Diagnosis not present

## 2022-12-13 DIAGNOSIS — E1142 Type 2 diabetes mellitus with diabetic polyneuropathy: Secondary | ICD-10-CM | POA: Diagnosis not present

## 2022-12-14 DIAGNOSIS — I129 Hypertensive chronic kidney disease with stage 1 through stage 4 chronic kidney disease, or unspecified chronic kidney disease: Secondary | ICD-10-CM | POA: Diagnosis not present

## 2022-12-14 DIAGNOSIS — E1142 Type 2 diabetes mellitus with diabetic polyneuropathy: Secondary | ICD-10-CM | POA: Diagnosis not present

## 2022-12-14 DIAGNOSIS — N1831 Chronic kidney disease, stage 3a: Secondary | ICD-10-CM | POA: Diagnosis not present

## 2022-12-14 DIAGNOSIS — F0280 Dementia in other diseases classified elsewhere without behavioral disturbance: Secondary | ICD-10-CM | POA: Diagnosis not present

## 2022-12-14 DIAGNOSIS — G311 Senile degeneration of brain, not elsewhere classified: Secondary | ICD-10-CM | POA: Diagnosis not present

## 2022-12-14 DIAGNOSIS — I739 Peripheral vascular disease, unspecified: Secondary | ICD-10-CM | POA: Diagnosis not present

## 2022-12-16 DIAGNOSIS — N1831 Chronic kidney disease, stage 3a: Secondary | ICD-10-CM | POA: Diagnosis not present

## 2022-12-16 DIAGNOSIS — F0280 Dementia in other diseases classified elsewhere without behavioral disturbance: Secondary | ICD-10-CM | POA: Diagnosis not present

## 2022-12-16 DIAGNOSIS — E785 Hyperlipidemia, unspecified: Secondary | ICD-10-CM | POA: Diagnosis not present

## 2022-12-16 DIAGNOSIS — I129 Hypertensive chronic kidney disease with stage 1 through stage 4 chronic kidney disease, or unspecified chronic kidney disease: Secondary | ICD-10-CM | POA: Diagnosis not present

## 2022-12-16 DIAGNOSIS — Z8673 Personal history of transient ischemic attack (TIA), and cerebral infarction without residual deficits: Secondary | ICD-10-CM | POA: Diagnosis not present

## 2022-12-16 DIAGNOSIS — R269 Unspecified abnormalities of gait and mobility: Secondary | ICD-10-CM | POA: Diagnosis not present

## 2022-12-16 DIAGNOSIS — G311 Senile degeneration of brain, not elsewhere classified: Secondary | ICD-10-CM | POA: Diagnosis not present

## 2022-12-16 DIAGNOSIS — F329 Major depressive disorder, single episode, unspecified: Secondary | ICD-10-CM | POA: Diagnosis not present

## 2022-12-16 DIAGNOSIS — L89152 Pressure ulcer of sacral region, stage 2: Secondary | ICD-10-CM | POA: Diagnosis not present

## 2022-12-16 DIAGNOSIS — I739 Peripheral vascular disease, unspecified: Secondary | ICD-10-CM | POA: Diagnosis not present

## 2022-12-16 DIAGNOSIS — E039 Hypothyroidism, unspecified: Secondary | ICD-10-CM | POA: Diagnosis not present

## 2022-12-16 DIAGNOSIS — E1142 Type 2 diabetes mellitus with diabetic polyneuropathy: Secondary | ICD-10-CM | POA: Diagnosis not present

## 2022-12-19 DIAGNOSIS — N1831 Chronic kidney disease, stage 3a: Secondary | ICD-10-CM | POA: Diagnosis not present

## 2022-12-19 DIAGNOSIS — F0280 Dementia in other diseases classified elsewhere without behavioral disturbance: Secondary | ICD-10-CM | POA: Diagnosis not present

## 2022-12-19 DIAGNOSIS — I129 Hypertensive chronic kidney disease with stage 1 through stage 4 chronic kidney disease, or unspecified chronic kidney disease: Secondary | ICD-10-CM | POA: Diagnosis not present

## 2022-12-19 DIAGNOSIS — E1142 Type 2 diabetes mellitus with diabetic polyneuropathy: Secondary | ICD-10-CM | POA: Diagnosis not present

## 2022-12-19 DIAGNOSIS — G311 Senile degeneration of brain, not elsewhere classified: Secondary | ICD-10-CM | POA: Diagnosis not present

## 2022-12-19 DIAGNOSIS — I739 Peripheral vascular disease, unspecified: Secondary | ICD-10-CM | POA: Diagnosis not present

## 2022-12-20 DIAGNOSIS — E1142 Type 2 diabetes mellitus with diabetic polyneuropathy: Secondary | ICD-10-CM | POA: Diagnosis not present

## 2022-12-20 DIAGNOSIS — F0280 Dementia in other diseases classified elsewhere without behavioral disturbance: Secondary | ICD-10-CM | POA: Diagnosis not present

## 2022-12-20 DIAGNOSIS — I739 Peripheral vascular disease, unspecified: Secondary | ICD-10-CM | POA: Diagnosis not present

## 2022-12-20 DIAGNOSIS — I129 Hypertensive chronic kidney disease with stage 1 through stage 4 chronic kidney disease, or unspecified chronic kidney disease: Secondary | ICD-10-CM | POA: Diagnosis not present

## 2022-12-20 DIAGNOSIS — N1831 Chronic kidney disease, stage 3a: Secondary | ICD-10-CM | POA: Diagnosis not present

## 2022-12-20 DIAGNOSIS — G311 Senile degeneration of brain, not elsewhere classified: Secondary | ICD-10-CM | POA: Diagnosis not present

## 2022-12-23 DIAGNOSIS — G311 Senile degeneration of brain, not elsewhere classified: Secondary | ICD-10-CM | POA: Diagnosis not present

## 2022-12-23 DIAGNOSIS — E1142 Type 2 diabetes mellitus with diabetic polyneuropathy: Secondary | ICD-10-CM | POA: Diagnosis not present

## 2022-12-23 DIAGNOSIS — I129 Hypertensive chronic kidney disease with stage 1 through stage 4 chronic kidney disease, or unspecified chronic kidney disease: Secondary | ICD-10-CM | POA: Diagnosis not present

## 2022-12-23 DIAGNOSIS — F0280 Dementia in other diseases classified elsewhere without behavioral disturbance: Secondary | ICD-10-CM | POA: Diagnosis not present

## 2022-12-23 DIAGNOSIS — I739 Peripheral vascular disease, unspecified: Secondary | ICD-10-CM | POA: Diagnosis not present

## 2022-12-23 DIAGNOSIS — N1831 Chronic kidney disease, stage 3a: Secondary | ICD-10-CM | POA: Diagnosis not present

## 2022-12-27 DIAGNOSIS — F0280 Dementia in other diseases classified elsewhere without behavioral disturbance: Secondary | ICD-10-CM | POA: Diagnosis not present

## 2022-12-27 DIAGNOSIS — N1831 Chronic kidney disease, stage 3a: Secondary | ICD-10-CM | POA: Diagnosis not present

## 2022-12-27 DIAGNOSIS — E1142 Type 2 diabetes mellitus with diabetic polyneuropathy: Secondary | ICD-10-CM | POA: Diagnosis not present

## 2022-12-27 DIAGNOSIS — I739 Peripheral vascular disease, unspecified: Secondary | ICD-10-CM | POA: Diagnosis not present

## 2022-12-27 DIAGNOSIS — G311 Senile degeneration of brain, not elsewhere classified: Secondary | ICD-10-CM | POA: Diagnosis not present

## 2022-12-27 DIAGNOSIS — I129 Hypertensive chronic kidney disease with stage 1 through stage 4 chronic kidney disease, or unspecified chronic kidney disease: Secondary | ICD-10-CM | POA: Diagnosis not present

## 2022-12-30 DIAGNOSIS — N1831 Chronic kidney disease, stage 3a: Secondary | ICD-10-CM | POA: Diagnosis not present

## 2022-12-30 DIAGNOSIS — G311 Senile degeneration of brain, not elsewhere classified: Secondary | ICD-10-CM | POA: Diagnosis not present

## 2022-12-30 DIAGNOSIS — I129 Hypertensive chronic kidney disease with stage 1 through stage 4 chronic kidney disease, or unspecified chronic kidney disease: Secondary | ICD-10-CM | POA: Diagnosis not present

## 2022-12-30 DIAGNOSIS — E1142 Type 2 diabetes mellitus with diabetic polyneuropathy: Secondary | ICD-10-CM | POA: Diagnosis not present

## 2022-12-30 DIAGNOSIS — F0280 Dementia in other diseases classified elsewhere without behavioral disturbance: Secondary | ICD-10-CM | POA: Diagnosis not present

## 2022-12-30 DIAGNOSIS — I739 Peripheral vascular disease, unspecified: Secondary | ICD-10-CM | POA: Diagnosis not present

## 2023-01-02 DIAGNOSIS — I739 Peripheral vascular disease, unspecified: Secondary | ICD-10-CM | POA: Diagnosis not present

## 2023-01-02 DIAGNOSIS — G311 Senile degeneration of brain, not elsewhere classified: Secondary | ICD-10-CM | POA: Diagnosis not present

## 2023-01-02 DIAGNOSIS — E1142 Type 2 diabetes mellitus with diabetic polyneuropathy: Secondary | ICD-10-CM | POA: Diagnosis not present

## 2023-01-02 DIAGNOSIS — F0280 Dementia in other diseases classified elsewhere without behavioral disturbance: Secondary | ICD-10-CM | POA: Diagnosis not present

## 2023-01-02 DIAGNOSIS — I129 Hypertensive chronic kidney disease with stage 1 through stage 4 chronic kidney disease, or unspecified chronic kidney disease: Secondary | ICD-10-CM | POA: Diagnosis not present

## 2023-01-02 DIAGNOSIS — N1831 Chronic kidney disease, stage 3a: Secondary | ICD-10-CM | POA: Diagnosis not present

## 2023-01-03 DIAGNOSIS — F0280 Dementia in other diseases classified elsewhere without behavioral disturbance: Secondary | ICD-10-CM | POA: Diagnosis not present

## 2023-01-03 DIAGNOSIS — I739 Peripheral vascular disease, unspecified: Secondary | ICD-10-CM | POA: Diagnosis not present

## 2023-01-03 DIAGNOSIS — N1831 Chronic kidney disease, stage 3a: Secondary | ICD-10-CM | POA: Diagnosis not present

## 2023-01-03 DIAGNOSIS — I129 Hypertensive chronic kidney disease with stage 1 through stage 4 chronic kidney disease, or unspecified chronic kidney disease: Secondary | ICD-10-CM | POA: Diagnosis not present

## 2023-01-03 DIAGNOSIS — G311 Senile degeneration of brain, not elsewhere classified: Secondary | ICD-10-CM | POA: Diagnosis not present

## 2023-01-03 DIAGNOSIS — E1142 Type 2 diabetes mellitus with diabetic polyneuropathy: Secondary | ICD-10-CM | POA: Diagnosis not present

## 2023-01-06 DIAGNOSIS — I129 Hypertensive chronic kidney disease with stage 1 through stage 4 chronic kidney disease, or unspecified chronic kidney disease: Secondary | ICD-10-CM | POA: Diagnosis not present

## 2023-01-06 DIAGNOSIS — F0280 Dementia in other diseases classified elsewhere without behavioral disturbance: Secondary | ICD-10-CM | POA: Diagnosis not present

## 2023-01-06 DIAGNOSIS — N1831 Chronic kidney disease, stage 3a: Secondary | ICD-10-CM | POA: Diagnosis not present

## 2023-01-06 DIAGNOSIS — E1142 Type 2 diabetes mellitus with diabetic polyneuropathy: Secondary | ICD-10-CM | POA: Diagnosis not present

## 2023-01-06 DIAGNOSIS — G311 Senile degeneration of brain, not elsewhere classified: Secondary | ICD-10-CM | POA: Diagnosis not present

## 2023-01-06 DIAGNOSIS — I739 Peripheral vascular disease, unspecified: Secondary | ICD-10-CM | POA: Diagnosis not present

## 2023-01-09 DIAGNOSIS — E1142 Type 2 diabetes mellitus with diabetic polyneuropathy: Secondary | ICD-10-CM | POA: Diagnosis not present

## 2023-01-09 DIAGNOSIS — I739 Peripheral vascular disease, unspecified: Secondary | ICD-10-CM | POA: Diagnosis not present

## 2023-01-09 DIAGNOSIS — N1831 Chronic kidney disease, stage 3a: Secondary | ICD-10-CM | POA: Diagnosis not present

## 2023-01-09 DIAGNOSIS — G311 Senile degeneration of brain, not elsewhere classified: Secondary | ICD-10-CM | POA: Diagnosis not present

## 2023-01-09 DIAGNOSIS — I129 Hypertensive chronic kidney disease with stage 1 through stage 4 chronic kidney disease, or unspecified chronic kidney disease: Secondary | ICD-10-CM | POA: Diagnosis not present

## 2023-01-09 DIAGNOSIS — F0280 Dementia in other diseases classified elsewhere without behavioral disturbance: Secondary | ICD-10-CM | POA: Diagnosis not present

## 2023-01-10 DIAGNOSIS — I129 Hypertensive chronic kidney disease with stage 1 through stage 4 chronic kidney disease, or unspecified chronic kidney disease: Secondary | ICD-10-CM | POA: Diagnosis not present

## 2023-01-10 DIAGNOSIS — G311 Senile degeneration of brain, not elsewhere classified: Secondary | ICD-10-CM | POA: Diagnosis not present

## 2023-01-10 DIAGNOSIS — I739 Peripheral vascular disease, unspecified: Secondary | ICD-10-CM | POA: Diagnosis not present

## 2023-01-10 DIAGNOSIS — F0280 Dementia in other diseases classified elsewhere without behavioral disturbance: Secondary | ICD-10-CM | POA: Diagnosis not present

## 2023-01-10 DIAGNOSIS — N1831 Chronic kidney disease, stage 3a: Secondary | ICD-10-CM | POA: Diagnosis not present

## 2023-01-10 DIAGNOSIS — E1142 Type 2 diabetes mellitus with diabetic polyneuropathy: Secondary | ICD-10-CM | POA: Diagnosis not present

## 2023-01-13 DIAGNOSIS — F0280 Dementia in other diseases classified elsewhere without behavioral disturbance: Secondary | ICD-10-CM | POA: Diagnosis not present

## 2023-01-13 DIAGNOSIS — I129 Hypertensive chronic kidney disease with stage 1 through stage 4 chronic kidney disease, or unspecified chronic kidney disease: Secondary | ICD-10-CM | POA: Diagnosis not present

## 2023-01-13 DIAGNOSIS — E1142 Type 2 diabetes mellitus with diabetic polyneuropathy: Secondary | ICD-10-CM | POA: Diagnosis not present

## 2023-01-13 DIAGNOSIS — N1831 Chronic kidney disease, stage 3a: Secondary | ICD-10-CM | POA: Diagnosis not present

## 2023-01-13 DIAGNOSIS — I739 Peripheral vascular disease, unspecified: Secondary | ICD-10-CM | POA: Diagnosis not present

## 2023-01-13 DIAGNOSIS — G311 Senile degeneration of brain, not elsewhere classified: Secondary | ICD-10-CM | POA: Diagnosis not present

## 2023-01-15 DIAGNOSIS — G311 Senile degeneration of brain, not elsewhere classified: Secondary | ICD-10-CM | POA: Diagnosis not present

## 2023-01-15 DIAGNOSIS — I739 Peripheral vascular disease, unspecified: Secondary | ICD-10-CM | POA: Diagnosis not present

## 2023-01-15 DIAGNOSIS — E039 Hypothyroidism, unspecified: Secondary | ICD-10-CM | POA: Diagnosis not present

## 2023-01-15 DIAGNOSIS — R269 Unspecified abnormalities of gait and mobility: Secondary | ICD-10-CM | POA: Diagnosis not present

## 2023-01-15 DIAGNOSIS — N1831 Chronic kidney disease, stage 3a: Secondary | ICD-10-CM | POA: Diagnosis not present

## 2023-01-15 DIAGNOSIS — Z8673 Personal history of transient ischemic attack (TIA), and cerebral infarction without residual deficits: Secondary | ICD-10-CM | POA: Diagnosis not present

## 2023-01-15 DIAGNOSIS — L89152 Pressure ulcer of sacral region, stage 2: Secondary | ICD-10-CM | POA: Diagnosis not present

## 2023-01-15 DIAGNOSIS — E1142 Type 2 diabetes mellitus with diabetic polyneuropathy: Secondary | ICD-10-CM | POA: Diagnosis not present

## 2023-01-15 DIAGNOSIS — I129 Hypertensive chronic kidney disease with stage 1 through stage 4 chronic kidney disease, or unspecified chronic kidney disease: Secondary | ICD-10-CM | POA: Diagnosis not present

## 2023-01-15 DIAGNOSIS — F0280 Dementia in other diseases classified elsewhere without behavioral disturbance: Secondary | ICD-10-CM | POA: Diagnosis not present

## 2023-01-15 DIAGNOSIS — F329 Major depressive disorder, single episode, unspecified: Secondary | ICD-10-CM | POA: Diagnosis not present

## 2023-01-15 DIAGNOSIS — E785 Hyperlipidemia, unspecified: Secondary | ICD-10-CM | POA: Diagnosis not present

## 2023-01-17 DIAGNOSIS — E1142 Type 2 diabetes mellitus with diabetic polyneuropathy: Secondary | ICD-10-CM | POA: Diagnosis not present

## 2023-01-17 DIAGNOSIS — I129 Hypertensive chronic kidney disease with stage 1 through stage 4 chronic kidney disease, or unspecified chronic kidney disease: Secondary | ICD-10-CM | POA: Diagnosis not present

## 2023-01-17 DIAGNOSIS — F0280 Dementia in other diseases classified elsewhere without behavioral disturbance: Secondary | ICD-10-CM | POA: Diagnosis not present

## 2023-01-17 DIAGNOSIS — N1831 Chronic kidney disease, stage 3a: Secondary | ICD-10-CM | POA: Diagnosis not present

## 2023-01-17 DIAGNOSIS — I739 Peripheral vascular disease, unspecified: Secondary | ICD-10-CM | POA: Diagnosis not present

## 2023-01-17 DIAGNOSIS — G311 Senile degeneration of brain, not elsewhere classified: Secondary | ICD-10-CM | POA: Diagnosis not present

## 2023-01-18 DIAGNOSIS — E1142 Type 2 diabetes mellitus with diabetic polyneuropathy: Secondary | ICD-10-CM | POA: Diagnosis not present

## 2023-01-18 DIAGNOSIS — F0280 Dementia in other diseases classified elsewhere without behavioral disturbance: Secondary | ICD-10-CM | POA: Diagnosis not present

## 2023-01-18 DIAGNOSIS — I739 Peripheral vascular disease, unspecified: Secondary | ICD-10-CM | POA: Diagnosis not present

## 2023-01-18 DIAGNOSIS — I129 Hypertensive chronic kidney disease with stage 1 through stage 4 chronic kidney disease, or unspecified chronic kidney disease: Secondary | ICD-10-CM | POA: Diagnosis not present

## 2023-01-18 DIAGNOSIS — G311 Senile degeneration of brain, not elsewhere classified: Secondary | ICD-10-CM | POA: Diagnosis not present

## 2023-01-18 DIAGNOSIS — N1831 Chronic kidney disease, stage 3a: Secondary | ICD-10-CM | POA: Diagnosis not present

## 2023-01-20 DIAGNOSIS — I129 Hypertensive chronic kidney disease with stage 1 through stage 4 chronic kidney disease, or unspecified chronic kidney disease: Secondary | ICD-10-CM | POA: Diagnosis not present

## 2023-01-20 DIAGNOSIS — G311 Senile degeneration of brain, not elsewhere classified: Secondary | ICD-10-CM | POA: Diagnosis not present

## 2023-01-20 DIAGNOSIS — E1142 Type 2 diabetes mellitus with diabetic polyneuropathy: Secondary | ICD-10-CM | POA: Diagnosis not present

## 2023-01-20 DIAGNOSIS — F0280 Dementia in other diseases classified elsewhere without behavioral disturbance: Secondary | ICD-10-CM | POA: Diagnosis not present

## 2023-01-20 DIAGNOSIS — I739 Peripheral vascular disease, unspecified: Secondary | ICD-10-CM | POA: Diagnosis not present

## 2023-01-20 DIAGNOSIS — N1831 Chronic kidney disease, stage 3a: Secondary | ICD-10-CM | POA: Diagnosis not present

## 2023-01-23 DIAGNOSIS — N1831 Chronic kidney disease, stage 3a: Secondary | ICD-10-CM | POA: Diagnosis not present

## 2023-01-23 DIAGNOSIS — G311 Senile degeneration of brain, not elsewhere classified: Secondary | ICD-10-CM | POA: Diagnosis not present

## 2023-01-23 DIAGNOSIS — I739 Peripheral vascular disease, unspecified: Secondary | ICD-10-CM | POA: Diagnosis not present

## 2023-01-23 DIAGNOSIS — F0280 Dementia in other diseases classified elsewhere without behavioral disturbance: Secondary | ICD-10-CM | POA: Diagnosis not present

## 2023-01-23 DIAGNOSIS — E1142 Type 2 diabetes mellitus with diabetic polyneuropathy: Secondary | ICD-10-CM | POA: Diagnosis not present

## 2023-01-23 DIAGNOSIS — I129 Hypertensive chronic kidney disease with stage 1 through stage 4 chronic kidney disease, or unspecified chronic kidney disease: Secondary | ICD-10-CM | POA: Diagnosis not present

## 2023-01-24 DIAGNOSIS — G311 Senile degeneration of brain, not elsewhere classified: Secondary | ICD-10-CM | POA: Diagnosis not present

## 2023-01-24 DIAGNOSIS — N1831 Chronic kidney disease, stage 3a: Secondary | ICD-10-CM | POA: Diagnosis not present

## 2023-01-24 DIAGNOSIS — F0280 Dementia in other diseases classified elsewhere without behavioral disturbance: Secondary | ICD-10-CM | POA: Diagnosis not present

## 2023-01-24 DIAGNOSIS — I739 Peripheral vascular disease, unspecified: Secondary | ICD-10-CM | POA: Diagnosis not present

## 2023-01-24 DIAGNOSIS — E1142 Type 2 diabetes mellitus with diabetic polyneuropathy: Secondary | ICD-10-CM | POA: Diagnosis not present

## 2023-01-24 DIAGNOSIS — I129 Hypertensive chronic kidney disease with stage 1 through stage 4 chronic kidney disease, or unspecified chronic kidney disease: Secondary | ICD-10-CM | POA: Diagnosis not present

## 2023-01-27 DIAGNOSIS — E1142 Type 2 diabetes mellitus with diabetic polyneuropathy: Secondary | ICD-10-CM | POA: Diagnosis not present

## 2023-01-27 DIAGNOSIS — I739 Peripheral vascular disease, unspecified: Secondary | ICD-10-CM | POA: Diagnosis not present

## 2023-01-27 DIAGNOSIS — I129 Hypertensive chronic kidney disease with stage 1 through stage 4 chronic kidney disease, or unspecified chronic kidney disease: Secondary | ICD-10-CM | POA: Diagnosis not present

## 2023-01-27 DIAGNOSIS — N1831 Chronic kidney disease, stage 3a: Secondary | ICD-10-CM | POA: Diagnosis not present

## 2023-01-27 DIAGNOSIS — G311 Senile degeneration of brain, not elsewhere classified: Secondary | ICD-10-CM | POA: Diagnosis not present

## 2023-01-27 DIAGNOSIS — F0280 Dementia in other diseases classified elsewhere without behavioral disturbance: Secondary | ICD-10-CM | POA: Diagnosis not present

## 2023-01-30 DIAGNOSIS — F0280 Dementia in other diseases classified elsewhere without behavioral disturbance: Secondary | ICD-10-CM | POA: Diagnosis not present

## 2023-01-30 DIAGNOSIS — G311 Senile degeneration of brain, not elsewhere classified: Secondary | ICD-10-CM | POA: Diagnosis not present

## 2023-01-30 DIAGNOSIS — I129 Hypertensive chronic kidney disease with stage 1 through stage 4 chronic kidney disease, or unspecified chronic kidney disease: Secondary | ICD-10-CM | POA: Diagnosis not present

## 2023-01-30 DIAGNOSIS — I739 Peripheral vascular disease, unspecified: Secondary | ICD-10-CM | POA: Diagnosis not present

## 2023-01-30 DIAGNOSIS — N1831 Chronic kidney disease, stage 3a: Secondary | ICD-10-CM | POA: Diagnosis not present

## 2023-01-30 DIAGNOSIS — E1142 Type 2 diabetes mellitus with diabetic polyneuropathy: Secondary | ICD-10-CM | POA: Diagnosis not present

## 2023-01-31 DIAGNOSIS — E1142 Type 2 diabetes mellitus with diabetic polyneuropathy: Secondary | ICD-10-CM | POA: Diagnosis not present

## 2023-01-31 DIAGNOSIS — G311 Senile degeneration of brain, not elsewhere classified: Secondary | ICD-10-CM | POA: Diagnosis not present

## 2023-01-31 DIAGNOSIS — I739 Peripheral vascular disease, unspecified: Secondary | ICD-10-CM | POA: Diagnosis not present

## 2023-01-31 DIAGNOSIS — I129 Hypertensive chronic kidney disease with stage 1 through stage 4 chronic kidney disease, or unspecified chronic kidney disease: Secondary | ICD-10-CM | POA: Diagnosis not present

## 2023-01-31 DIAGNOSIS — F0280 Dementia in other diseases classified elsewhere without behavioral disturbance: Secondary | ICD-10-CM | POA: Diagnosis not present

## 2023-01-31 DIAGNOSIS — N1831 Chronic kidney disease, stage 3a: Secondary | ICD-10-CM | POA: Diagnosis not present

## 2023-02-03 DIAGNOSIS — F0280 Dementia in other diseases classified elsewhere without behavioral disturbance: Secondary | ICD-10-CM | POA: Diagnosis not present

## 2023-02-03 DIAGNOSIS — N1831 Chronic kidney disease, stage 3a: Secondary | ICD-10-CM | POA: Diagnosis not present

## 2023-02-03 DIAGNOSIS — G311 Senile degeneration of brain, not elsewhere classified: Secondary | ICD-10-CM | POA: Diagnosis not present

## 2023-02-03 DIAGNOSIS — I739 Peripheral vascular disease, unspecified: Secondary | ICD-10-CM | POA: Diagnosis not present

## 2023-02-03 DIAGNOSIS — E1142 Type 2 diabetes mellitus with diabetic polyneuropathy: Secondary | ICD-10-CM | POA: Diagnosis not present

## 2023-02-03 DIAGNOSIS — I129 Hypertensive chronic kidney disease with stage 1 through stage 4 chronic kidney disease, or unspecified chronic kidney disease: Secondary | ICD-10-CM | POA: Diagnosis not present

## 2023-02-06 DIAGNOSIS — I129 Hypertensive chronic kidney disease with stage 1 through stage 4 chronic kidney disease, or unspecified chronic kidney disease: Secondary | ICD-10-CM | POA: Diagnosis not present

## 2023-02-06 DIAGNOSIS — G311 Senile degeneration of brain, not elsewhere classified: Secondary | ICD-10-CM | POA: Diagnosis not present

## 2023-02-06 DIAGNOSIS — F0280 Dementia in other diseases classified elsewhere without behavioral disturbance: Secondary | ICD-10-CM | POA: Diagnosis not present

## 2023-02-06 DIAGNOSIS — E1142 Type 2 diabetes mellitus with diabetic polyneuropathy: Secondary | ICD-10-CM | POA: Diagnosis not present

## 2023-02-06 DIAGNOSIS — N1831 Chronic kidney disease, stage 3a: Secondary | ICD-10-CM | POA: Diagnosis not present

## 2023-02-06 DIAGNOSIS — I739 Peripheral vascular disease, unspecified: Secondary | ICD-10-CM | POA: Diagnosis not present

## 2023-02-07 DIAGNOSIS — I129 Hypertensive chronic kidney disease with stage 1 through stage 4 chronic kidney disease, or unspecified chronic kidney disease: Secondary | ICD-10-CM | POA: Diagnosis not present

## 2023-02-07 DIAGNOSIS — F0280 Dementia in other diseases classified elsewhere without behavioral disturbance: Secondary | ICD-10-CM | POA: Diagnosis not present

## 2023-02-07 DIAGNOSIS — N1831 Chronic kidney disease, stage 3a: Secondary | ICD-10-CM | POA: Diagnosis not present

## 2023-02-07 DIAGNOSIS — I739 Peripheral vascular disease, unspecified: Secondary | ICD-10-CM | POA: Diagnosis not present

## 2023-02-07 DIAGNOSIS — E1142 Type 2 diabetes mellitus with diabetic polyneuropathy: Secondary | ICD-10-CM | POA: Diagnosis not present

## 2023-02-07 DIAGNOSIS — G311 Senile degeneration of brain, not elsewhere classified: Secondary | ICD-10-CM | POA: Diagnosis not present

## 2023-02-10 DIAGNOSIS — I739 Peripheral vascular disease, unspecified: Secondary | ICD-10-CM | POA: Diagnosis not present

## 2023-02-10 DIAGNOSIS — I129 Hypertensive chronic kidney disease with stage 1 through stage 4 chronic kidney disease, or unspecified chronic kidney disease: Secondary | ICD-10-CM | POA: Diagnosis not present

## 2023-02-10 DIAGNOSIS — F0280 Dementia in other diseases classified elsewhere without behavioral disturbance: Secondary | ICD-10-CM | POA: Diagnosis not present

## 2023-02-10 DIAGNOSIS — N1831 Chronic kidney disease, stage 3a: Secondary | ICD-10-CM | POA: Diagnosis not present

## 2023-02-10 DIAGNOSIS — E1142 Type 2 diabetes mellitus with diabetic polyneuropathy: Secondary | ICD-10-CM | POA: Diagnosis not present

## 2023-02-10 DIAGNOSIS — G311 Senile degeneration of brain, not elsewhere classified: Secondary | ICD-10-CM | POA: Diagnosis not present

## 2023-02-13 DIAGNOSIS — I129 Hypertensive chronic kidney disease with stage 1 through stage 4 chronic kidney disease, or unspecified chronic kidney disease: Secondary | ICD-10-CM | POA: Diagnosis not present

## 2023-02-13 DIAGNOSIS — G311 Senile degeneration of brain, not elsewhere classified: Secondary | ICD-10-CM | POA: Diagnosis not present

## 2023-02-13 DIAGNOSIS — I739 Peripheral vascular disease, unspecified: Secondary | ICD-10-CM | POA: Diagnosis not present

## 2023-02-13 DIAGNOSIS — E1142 Type 2 diabetes mellitus with diabetic polyneuropathy: Secondary | ICD-10-CM | POA: Diagnosis not present

## 2023-02-13 DIAGNOSIS — F0280 Dementia in other diseases classified elsewhere without behavioral disturbance: Secondary | ICD-10-CM | POA: Diagnosis not present

## 2023-02-13 DIAGNOSIS — N1831 Chronic kidney disease, stage 3a: Secondary | ICD-10-CM | POA: Diagnosis not present

## 2023-02-14 DIAGNOSIS — I129 Hypertensive chronic kidney disease with stage 1 through stage 4 chronic kidney disease, or unspecified chronic kidney disease: Secondary | ICD-10-CM | POA: Diagnosis not present

## 2023-02-14 DIAGNOSIS — F0280 Dementia in other diseases classified elsewhere without behavioral disturbance: Secondary | ICD-10-CM | POA: Diagnosis not present

## 2023-02-14 DIAGNOSIS — E1142 Type 2 diabetes mellitus with diabetic polyneuropathy: Secondary | ICD-10-CM | POA: Diagnosis not present

## 2023-02-14 DIAGNOSIS — N1831 Chronic kidney disease, stage 3a: Secondary | ICD-10-CM | POA: Diagnosis not present

## 2023-02-14 DIAGNOSIS — G311 Senile degeneration of brain, not elsewhere classified: Secondary | ICD-10-CM | POA: Diagnosis not present

## 2023-02-14 DIAGNOSIS — I739 Peripheral vascular disease, unspecified: Secondary | ICD-10-CM | POA: Diagnosis not present
